# Patient Record
Sex: Female | Born: 1985 | Race: Black or African American | Hispanic: No | Marital: Single | Smoking: Never smoker
Health system: Southern US, Community
[De-identification: ages and names within clinical notes are randomized; demographics above are authoritative.]

## PROBLEM LIST (undated history)

## (undated) DIAGNOSIS — K219 Gastro-esophageal reflux disease without esophagitis: Secondary | ICD-10-CM

## (undated) DIAGNOSIS — M795 Residual foreign body in soft tissue: Secondary | ICD-10-CM

## (undated) DIAGNOSIS — R7303 Prediabetes: Secondary | ICD-10-CM

## (undated) DIAGNOSIS — E119 Type 2 diabetes mellitus without complications: Secondary | ICD-10-CM

## (undated) HISTORY — PX: LIPOSUCTION: SHX10

## (undated) HISTORY — DX: Type 2 diabetes mellitus without complications: E11.9

## (undated) HISTORY — PX: ABDOMINAL SURGERY: SHX537

## (undated) HISTORY — DX: Gastro-esophageal reflux disease without esophagitis: K21.9

---
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
## 2004-02-04 ENCOUNTER — Emergency Department (HOSPITAL_COMMUNITY): Admission: EM | Admit: 2004-02-04 | Discharge: 2004-02-04 | Payer: Self-pay | Admitting: Family Medicine

## 2004-05-23 ENCOUNTER — Other Ambulatory Visit: Admission: RE | Admit: 2004-05-23 | Discharge: 2004-05-23 | Payer: Self-pay | Admitting: Obstetrics and Gynecology

## 2004-10-10 ENCOUNTER — Emergency Department (HOSPITAL_COMMUNITY): Admission: EM | Admit: 2004-10-10 | Discharge: 2004-10-10 | Payer: Self-pay | Admitting: Family Medicine

## 2004-11-13 ENCOUNTER — Other Ambulatory Visit: Admission: RE | Admit: 2004-11-13 | Discharge: 2004-11-13 | Payer: Self-pay | Admitting: Obstetrics and Gynecology

## 2004-12-27 ENCOUNTER — Emergency Department (HOSPITAL_COMMUNITY): Admission: EM | Admit: 2004-12-27 | Discharge: 2004-12-27 | Payer: Self-pay | Admitting: Family Medicine

## 2005-05-29 ENCOUNTER — Other Ambulatory Visit: Admission: RE | Admit: 2005-05-29 | Discharge: 2005-05-29 | Payer: Self-pay | Admitting: Obstetrics and Gynecology

## 2006-04-12 ENCOUNTER — Other Ambulatory Visit: Admission: RE | Admit: 2006-04-12 | Discharge: 2006-04-12 | Payer: Self-pay | Admitting: Obstetrics and Gynecology

## 2006-09-29 ENCOUNTER — Inpatient Hospital Stay (HOSPITAL_COMMUNITY): Admission: AD | Admit: 2006-09-29 | Discharge: 2006-09-29 | Payer: Self-pay | Admitting: Obstetrics and Gynecology

## 2009-07-15 ENCOUNTER — Emergency Department (HOSPITAL_COMMUNITY): Admission: EM | Admit: 2009-07-15 | Discharge: 2009-07-15 | Payer: Self-pay | Admitting: Family Medicine

## 2009-10-26 ENCOUNTER — Emergency Department (HOSPITAL_COMMUNITY): Admission: EM | Admit: 2009-10-26 | Discharge: 2009-10-26 | Payer: Self-pay | Admitting: Family Medicine

## 2010-08-07 ENCOUNTER — Emergency Department (HOSPITAL_COMMUNITY): Admission: EM | Admit: 2010-08-07 | Discharge: 2010-08-07 | Payer: Self-pay | Admitting: Emergency Medicine

## 2010-08-26 ENCOUNTER — Emergency Department (HOSPITAL_COMMUNITY): Admission: EM | Admit: 2010-08-26 | Discharge: 2010-08-26 | Payer: Self-pay | Admitting: Emergency Medicine

## 2010-12-27 LAB — URINALYSIS, ROUTINE W REFLEX MICROSCOPIC
Glucose, UA: NEGATIVE mg/dL
Ketones, ur: NEGATIVE mg/dL
Nitrite: NEGATIVE
Protein, ur: NEGATIVE mg/dL

## 2010-12-27 LAB — WET PREP, GENITAL
WBC, Wet Prep HPF POC: NONE SEEN
Yeast Wet Prep HPF POC: NONE SEEN

## 2010-12-27 LAB — PREGNANCY, URINE: Preg Test, Ur: NEGATIVE

## 2012-01-05 ENCOUNTER — Emergency Department (HOSPITAL_COMMUNITY): Payer: Self-pay

## 2012-01-05 ENCOUNTER — Other Ambulatory Visit: Payer: Self-pay

## 2012-01-05 ENCOUNTER — Encounter (HOSPITAL_COMMUNITY): Payer: Self-pay | Admitting: *Deleted

## 2012-01-05 ENCOUNTER — Emergency Department (HOSPITAL_COMMUNITY)
Admission: EM | Admit: 2012-01-05 | Discharge: 2012-01-05 | Disposition: A | Discharge status: Home / Self Care | Payer: Self-pay | Attending: Emergency Medicine | Admitting: Emergency Medicine

## 2012-01-05 DIAGNOSIS — S41009A Unspecified open wound of unspecified shoulder, initial encounter: Secondary | ICD-10-CM | POA: Insufficient documentation

## 2012-01-05 DIAGNOSIS — Z1889 Other specified retained foreign body fragments: Secondary | ICD-10-CM | POA: Insufficient documentation

## 2012-01-05 DIAGNOSIS — Y92410 Unspecified street and highway as the place of occurrence of the external cause: Secondary | ICD-10-CM | POA: Insufficient documentation

## 2012-01-05 DIAGNOSIS — S21109A Unspecified open wound of unspecified front wall of thorax without penetration into thoracic cavity, initial encounter: Secondary | ICD-10-CM | POA: Insufficient documentation

## 2012-01-05 DIAGNOSIS — W3400XA Accidental discharge from unspecified firearms or gun, initial encounter: Secondary | ICD-10-CM

## 2012-01-05 LAB — POCT I-STAT, CHEM 8
Calcium, Ion: 1.1 mmol/L — ABNORMAL LOW (ref 1.12–1.32)
Chloride: 107 mEq/L (ref 96–112)
Creatinine, Ser: 0.9 mg/dL (ref 0.50–1.10)
Glucose, Bld: 167 mg/dL — ABNORMAL HIGH (ref 70–99)
HCT: 34 % — ABNORMAL LOW (ref 36.0–46.0)

## 2012-01-05 LAB — TYPE AND SCREEN: Unit division: 0

## 2012-01-05 LAB — COMPREHENSIVE METABOLIC PANEL
BUN: 13 mg/dL (ref 6–23)
CO2: 20 mEq/L (ref 19–32)
Calcium: 9.2 mg/dL (ref 8.4–10.5)
Creatinine, Ser: 0.98 mg/dL (ref 0.50–1.10)
GFR calc Af Amer: >90 mL/min (ref >90)
GFR calc non Af Amer: 78 mL/min — ABNORMAL LOW (ref >90)
Glucose, Bld: 159 mg/dL — ABNORMAL HIGH (ref 70–99)

## 2012-01-05 LAB — CBC
Hemoglobin: 12.8 g/dL (ref 12.0–15.0)
Platelets: 170 10*3/uL (ref 150–400)
RBC: 4.63 MIL/uL (ref 3.87–5.11)
WBC: 7.7 10*3/uL (ref 4.0–10.5)

## 2012-01-05 LAB — ABO/RH: ABO/RH(D): A POS

## 2012-01-05 LAB — LACTIC ACID, PLASMA: Lactic Acid, Venous: 6 mmol/L — ABNORMAL HIGH (ref 0.5–2.2)

## 2012-01-05 LAB — PROTIME-INR: Prothrombin Time: 13.5 seconds (ref 11.6–15.2)

## 2012-01-05 MED ORDER — OXYCODONE-ACETAMINOPHEN 5-325 MG PO TABS: 1.0000 | ORAL_TABLET | ORAL | Status: AC | PRN

## 2012-01-05 MED ORDER — TETANUS-DIPHTH-ACELL PERTUSSIS 5-2.5-18.5 LF-MCG/0.5 IM SUSP
0.5000 mL | Freq: Once | INTRAMUSCULAR | Status: AC
  Administered 2012-01-05: 0.5 mL via INTRAMUSCULAR
  Filled 2012-01-05: qty 0.5

## 2012-01-05 MED ORDER — SODIUM CHLORIDE 0.9 % IV BOLUS (SEPSIS)
1000.0000 mL | Freq: Once | INTRAVENOUS | Status: AC
  Administered 2012-01-05: 1000 mL via INTRAVENOUS

## 2012-01-05 MED ORDER — OXYCODONE-ACETAMINOPHEN 5-325 MG PO TABS: 2.0000 | ORAL_TABLET | Freq: Once | ORAL | Status: DC

## 2012-01-05 MED ORDER — MORPHINE SULFATE 4 MG/ML IJ SOLN
4.0000 mg | Freq: Once | INTRAMUSCULAR | Status: AC
  Administered 2012-01-05: 4 mg via INTRAVENOUS
  Filled 2012-01-05: qty 1

## 2012-01-05 MED ORDER — NAPROXEN 500 MG PO TABS: 500.0000 mg | ORAL_TABLET | Freq: Two times a day (BID) | ORAL | Status: DC

## 2012-01-05 MED ORDER — BACITRACIN ZINC 500 UNIT/GM EX OINT
1.0000 "application " | TOPICAL_OINTMENT | CUTANEOUS | Status: DC
  Filled 2012-01-05: qty 0.9

## 2012-01-05 MED ORDER — FENTANYL CITRATE 0.05 MG/ML IJ SOLN: 50.0000 ug | Freq: Once | INTRAMUSCULAR | Status: DC

## 2012-01-05 MED ORDER — BACITRACIN ZINC 500 UNIT/GM EX OINT: TOPICAL_OINTMENT | Freq: Two times a day (BID) | CUTANEOUS | Status: AC

## 2012-01-05 MED ORDER — OXYCODONE-ACETAMINOPHEN 5-325 MG PO TABS
ORAL_TABLET | ORAL | Status: AC
  Filled 2012-01-05: qty 2
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
## 2012-01-05 NOTE — ED Notes (Signed)
Patient rates pain 8/10 at this time
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
## 2012-01-29 ENCOUNTER — Emergency Department (HOSPITAL_COMMUNITY)
Admission: EM | Admit: 2012-01-29 | Discharge: 2012-01-29 | Disposition: A | Discharge status: Home / Self Care | Payer: Self-pay | Attending: Emergency Medicine | Admitting: Emergency Medicine

## 2012-01-29 ENCOUNTER — Encounter (HOSPITAL_COMMUNITY): Payer: Self-pay

## 2012-01-29 DIAGNOSIS — M25519 Pain in unspecified shoulder: Secondary | ICD-10-CM | POA: Insufficient documentation

## 2012-01-29 DIAGNOSIS — Z87828 Personal history of other (healed) physical injury and trauma: Secondary | ICD-10-CM | POA: Insufficient documentation

## 2012-01-29 DIAGNOSIS — M25512 Pain in left shoulder: Secondary | ICD-10-CM

## 2012-01-29 NOTE — Discharge Instructions (Signed)
Take Tylenol or naproxen as prescribed to you earlier needed for pain. Keep your scheduled appointment with the trauma followup clinic in 2 days. Return if her condition worsens for any reason
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
## 2012-03-18 ENCOUNTER — Emergency Department (HOSPITAL_COMMUNITY)
Admission: EM | Admit: 2012-03-18 | Discharge: 2012-03-18 | Disposition: A | Discharge status: Home / Self Care | Payer: Self-pay | Attending: Emergency Medicine | Admitting: Emergency Medicine

## 2012-03-18 ENCOUNTER — Encounter (HOSPITAL_COMMUNITY): Payer: Self-pay

## 2012-03-18 DIAGNOSIS — Y92009 Unspecified place in unspecified non-institutional (private) residence as the place of occurrence of the external cause: Secondary | ICD-10-CM | POA: Insufficient documentation

## 2012-03-18 DIAGNOSIS — S6990XA Unspecified injury of unspecified wrist, hand and finger(s), initial encounter: Secondary | ICD-10-CM

## 2012-03-18 DIAGNOSIS — W260XXA Contact with knife, initial encounter: Secondary | ICD-10-CM | POA: Insufficient documentation

## 2012-03-18 DIAGNOSIS — S61209A Unspecified open wound of unspecified finger without damage to nail, initial encounter: Secondary | ICD-10-CM | POA: Insufficient documentation

## 2012-03-18 HISTORY — DX: Residual foreign body in soft tissue: M79.5

## 2012-03-18 MED ORDER — OXYCODONE-ACETAMINOPHEN 5-325 MG PO TABS: 1.0000 | ORAL_TABLET | Freq: Four times a day (QID) | ORAL | Status: AC | PRN

## 2012-03-18 MED ORDER — LIDOCAINE HCL 1 % IJ SOLN
INTRAMUSCULAR | Status: AC
  Administered 2012-03-18: 17:00:00
  Filled 2012-03-18: qty 20

## 2012-03-18 NOTE — ED Notes (Signed)
Was cutting chicken last night and cut her LT middle finger.  States she cleaned it up, held pressure but it throbbed all night.
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
## 2012-03-18 NOTE — Discharge Instructions (Signed)
Do not drive or operate heavy machinery while taking pain medication. °

## 2012-03-21 NOTE — ED Provider Notes (Signed)
Medical screening examination/treatment/procedure(s) were performed by non-physician practitioner and as supervising physician I was immediately available for consultation/collaboration.    Lamiah Marmol R Hanad Leino, MD 03/21/12 1321 

## 2012-07-10 ENCOUNTER — Emergency Department (HOSPITAL_COMMUNITY): Payer: Self-pay

## 2012-07-10 ENCOUNTER — Emergency Department (HOSPITAL_COMMUNITY)
Admission: EM | Admit: 2012-07-10 | Discharge: 2012-07-10 | Disposition: A | Discharge status: Home / Self Care | Payer: Self-pay | Attending: Emergency Medicine | Admitting: Emergency Medicine

## 2012-07-10 ENCOUNTER — Encounter (HOSPITAL_COMMUNITY): Payer: Self-pay

## 2012-07-10 DIAGNOSIS — H00039 Abscess of eyelid unspecified eye, unspecified eyelid: Secondary | ICD-10-CM | POA: Insufficient documentation

## 2012-07-10 DIAGNOSIS — L03213 Periorbital cellulitis: Secondary | ICD-10-CM

## 2012-07-10 LAB — CBC WITH DIFFERENTIAL/PLATELET
Basophils Absolute: 0 10*3/uL (ref 0.0–0.1)
HCT: 38.7 % (ref 36.0–46.0)
Lymphocytes Relative: 25 % (ref 12–46)
Monocytes Absolute: 0.6 10*3/uL (ref 0.1–1.0)
Neutro Abs: 3.3 10*3/uL (ref 1.7–7.7)
Neutrophils Relative %: 62 % (ref 43–77)
Platelets: 260 10*3/uL (ref 150–400)
RDW: 13.7 % (ref 11.5–15.5)
WBC: 5.4 10*3/uL (ref 4.0–10.5)

## 2012-07-10 MED ORDER — IOHEXOL 300 MG/ML  SOLN
100.0000 mL | Freq: Once | INTRAMUSCULAR | Status: AC | PRN
  Administered 2012-07-10: 100 mL via INTRAVENOUS

## 2012-07-10 MED ORDER — AMOXICILLIN-POT CLAVULANATE 875-125 MG PO TABS: 1.0000 | ORAL_TABLET | Freq: Two times a day (BID) | ORAL | Status: DC

## 2012-07-10 MED ORDER — DEXTROSE 5 % IV SOLN
2.0000 g | Freq: Once | INTRAVENOUS | Status: AC
  Administered 2012-07-10: 2 g via INTRAVENOUS
  Filled 2012-07-10: qty 2

## 2012-07-10 MED ORDER — SODIUM CHLORIDE 0.9 % IV SOLN
INTRAVENOUS | Status: DC
  Administered 2012-07-10: 07:00:00 via INTRAVENOUS

## 2012-07-10 NOTE — ED Notes (Signed)
Per pt, eye started swelling with drainage Friday. Pt went over the counter at pharmacy for eye drop.  Eye has progressively worsened.  Eye half closed with visual changes.  Pt also has swelling to right lower face.  No pain in mouth.  Tenderness noted from ear following jaw line with knot.  No fever noted. Pt has not started new meds, facial creams or makeup.

## 2012-07-10 NOTE — ED Notes (Signed)
Pt has significant rt sided facial edema and left eye redness/edema. Pt states she woke up Sat morning with the swelling, which has worsened. [facility] recommended that she use "irritated eye drops" that have not helped. Pt is now experiencing a loss of vision.
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
## 2013-12-18 ENCOUNTER — Encounter (HOSPITAL_COMMUNITY): Payer: Self-pay | Admitting: Emergency Medicine

## 2013-12-18 ENCOUNTER — Emergency Department (HOSPITAL_COMMUNITY)
Admission: EM | Admit: 2013-12-18 | Discharge: 2013-12-18 | Disposition: A | Discharge status: Home / Self Care | Payer: BC Managed Care – PPO | Attending: Emergency Medicine | Admitting: Emergency Medicine

## 2013-12-18 DIAGNOSIS — Z5189 Encounter for other specified aftercare: Secondary | ICD-10-CM

## 2013-12-18 DIAGNOSIS — E663 Overweight: Secondary | ICD-10-CM | POA: Insufficient documentation

## 2013-12-18 DIAGNOSIS — R109 Unspecified abdominal pain: Secondary | ICD-10-CM | POA: Insufficient documentation

## 2013-12-18 DIAGNOSIS — Z4801 Encounter for change or removal of surgical wound dressing: Secondary | ICD-10-CM | POA: Insufficient documentation

## 2013-12-18 DIAGNOSIS — Z87828 Personal history of other (healed) physical injury and trauma: Secondary | ICD-10-CM | POA: Insufficient documentation

## 2013-12-18 NOTE — ED Notes (Signed)
Pt reports having "tummy tuck" done on 2/20. Had drain taken out after 1 week now reports incision is open and she is having "2 out of 6 signs of infection". She reports pain 7/10. Sts she was told by her plastic surgeon they will not close the incision back up but let it heal.
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
## 2013-12-18 NOTE — Discharge Instructions (Signed)
You were seen today for a wound recheck. Your wound appears well healing with a small gaping area without evidence of overt infection. You should continue antibiotics as directed by your primary surgeon. You should followup with your surgeon regarding further wound care and management. She developed fevers, increasing redness or drainage from the site he should return to your surgeon for recheck.

## 2015-03-02 ENCOUNTER — Other Ambulatory Visit (INDEPENDENT_AMBULATORY_CARE_PROVIDER_SITE_OTHER): Payer: Self-pay | Admitting: General Surgery

## 2015-03-02 NOTE — Addendum Note (Signed)
Addended by: Redmond Pulling Antoniette Peake M on: 03/02/2015 12:33 PM   Modules accepted: Orders

## 2015-03-11 ENCOUNTER — Other Ambulatory Visit (INDEPENDENT_AMBULATORY_CARE_PROVIDER_SITE_OTHER): Payer: Self-pay | Admitting: General Surgery

## 2015-03-11 NOTE — Addendum Note (Signed)
Addended by: Redmond Pulling, Kolter Reaver M on: 03/11/2015 05:05 PM   Modules accepted: Orders

## 2015-03-30 ENCOUNTER — Other Ambulatory Visit: Payer: Self-pay | Admitting: General Surgery

## 2015-04-23 ENCOUNTER — Ambulatory Visit: Payer: Self-pay | Admitting: Dietician

## 2015-04-27 ENCOUNTER — Ambulatory Visit (HOSPITAL_COMMUNITY): Admission: RE | Admit: 2015-04-27 | Payer: Self-pay | Source: Ambulatory Visit

## 2015-05-09 ENCOUNTER — Ambulatory Visit (HOSPITAL_COMMUNITY)
Admission: RE | Admit: 2015-05-09 | Payer: BLUE CROSS/BLUE SHIELD | Source: Ambulatory Visit | Admitting: General Surgery

## 2015-05-09 ENCOUNTER — Encounter (HOSPITAL_COMMUNITY): Admission: RE | Payer: Self-pay | Source: Ambulatory Visit

## 2015-05-09 SURGERY — BREATH TEST, FOR HELICOBACTER PYLORI

## 2015-05-17 ENCOUNTER — Emergency Department (HOSPITAL_COMMUNITY)
Admission: EM | Admit: 2015-05-17 | Discharge: 2015-05-17 | Disposition: A | Discharge status: Home / Self Care | Payer: BLUE CROSS/BLUE SHIELD | Source: Home / Self Care | Attending: Emergency Medicine | Admitting: Emergency Medicine

## 2015-05-17 ENCOUNTER — Encounter (HOSPITAL_COMMUNITY): Payer: Self-pay | Admitting: Emergency Medicine

## 2015-05-17 DIAGNOSIS — H16201 Unspecified keratoconjunctivitis, right eye: Secondary | ICD-10-CM

## 2015-05-17 MED ORDER — FLUORESCEIN SODIUM 1 MG OP STRP
ORAL_STRIP | OPHTHALMIC | Status: AC
Start: 2015-05-17 — End: 2015-05-17
  Filled 2015-05-17: qty 1

## 2015-05-17 MED ORDER — TOBRAMYCIN-DEXAMETHASONE 0.3-0.1 % OP SUSP: 2.0000 [drp] | OPHTHALMIC | Status: DC

## 2015-05-17 NOTE — Discharge Instructions (Signed)
Please use the eyedrops every 4 hours. You have an appointment at [facility] tomorrow morning at 10 AM.
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
## 2015-05-17 NOTE — ED Notes (Signed)
C/o right eye infection Sunday  States eye is swollen, green discharge, drainage Eye drops used as tx

## 2015-12-12 ENCOUNTER — Encounter (HOSPITAL_COMMUNITY): Payer: Self-pay

## 2015-12-12 ENCOUNTER — Emergency Department (HOSPITAL_COMMUNITY)
Admission: EM | Admit: 2015-12-12 | Discharge: 2015-12-12 | Disposition: A | Discharge status: Home / Self Care | Payer: BLUE CROSS/BLUE SHIELD | Attending: Emergency Medicine | Admitting: Emergency Medicine

## 2015-12-12 DIAGNOSIS — N644 Mastodynia: Secondary | ICD-10-CM | POA: Insufficient documentation

## 2015-12-12 NOTE — ED Notes (Signed)
Pt complains of a burning left breast pain for three weeks, she's unable to tell if it's swollen because they are large but she's concerned because it's continued for three weeks and every now and then there will be a shooting pain through that pbreast

## 2016-06-21 ENCOUNTER — Ambulatory Visit (HOSPITAL_COMMUNITY)
Admission: EM | Admit: 2016-06-21 | Discharge: 2016-06-21 | Disposition: A | Discharge status: Home / Self Care | Payer: BLUE CROSS/BLUE SHIELD | Attending: Internal Medicine | Admitting: Internal Medicine

## 2016-06-21 ENCOUNTER — Encounter (HOSPITAL_COMMUNITY): Payer: Self-pay | Admitting: Emergency Medicine

## 2016-06-21 DIAGNOSIS — R739 Hyperglycemia, unspecified: Secondary | ICD-10-CM

## 2016-06-21 DIAGNOSIS — R51 Headache: Secondary | ICD-10-CM

## 2016-06-21 DIAGNOSIS — R7309 Other abnormal glucose: Secondary | ICD-10-CM

## 2016-06-21 DIAGNOSIS — R519 Headache, unspecified: Secondary | ICD-10-CM

## 2016-06-21 LAB — GLUCOSE, CAPILLARY: GLUCOSE-CAPILLARY: 104 mg/dL — AB (ref 65–99)

## 2016-06-21 MED ORDER — ACETAMINOPHEN 325 MG PO TABS
ORAL_TABLET | ORAL | Status: AC
  Filled 2016-06-21: qty 2

## 2016-06-21 MED ORDER — ACETAMINOPHEN 325 MG PO TABS
650.0000 mg | ORAL_TABLET | Freq: Once | ORAL | Status: AC
  Administered 2016-06-21: 650 mg via ORAL

## 2016-06-21 MED ORDER — KETOROLAC TROMETHAMINE 60 MG/2ML IM SOLN
INTRAMUSCULAR | Status: AC
Start: 2016-06-21 — End: 2016-06-21
  Filled 2016-06-21: qty 2

## 2016-06-21 MED ORDER — KETOROLAC TROMETHAMINE 60 MG/2ML IM SOLN
60.0000 mg | Freq: Once | INTRAMUSCULAR | Status: AC
  Administered 2016-06-21: 60 mg via INTRAMUSCULAR

## 2016-06-21 MED ORDER — ACETAMINOPHEN 500 MG PO TABS: 1000.0000 mg | ORAL_TABLET | Freq: Once | ORAL | Status: DC

## 2016-06-21 NOTE — ED Triage Notes (Signed)
Patient has concerns about her work day today.  Patient reports feeling dizzy, seeing white spots in vision fields. Patient reports she still has not eating, patient has a water bottle with her.   Patient does not see white spots now, but continues to have a headache.  Patient dropped off by supervisor.  Patient reports she is still feeling dizzy
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
## 2016-06-21 NOTE — Discharge Instructions (Signed)
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  If you feel rushed or nauseate in the morning and cannot eat a "full" breakfast, you should at least have a granola bar, small bowl of instant oat meal, whole grain cereal, a piece of fruit, or anything else that you can grab on the go.    Be sure to carry a water bottle during your shift and keep small snacks in your bag to help you get through the day if it is too busy for you to take a lunch break.  It is important to establish care with a primary care provider for ongoing health maintenance to make sure you do not have, or are not developing diabetes as it can be very dangerous if it goes untreated.    Emergency Department Resource Guide 1) Find a Doctor and Pay Out of Pocket Although you won't have to find out who is covered by your insurance plan, it is a good idea to ask around and get recommendations. You will then need to call the office and see if the doctor you have chosen will accept you as a new patient and what types of options they offer for patients who are self-pay. Some doctors offer discounts or will set up payment plans for their patients who do not have insurance, but you will need to ask so you aren't surprised when you get to your appointment.  2) Contact Your Local Health Department Not all health departments have doctors that can see patients for sick visits, but many do, so it is worth a call to see if yours does. If you don't know where your local health department is, you can check in your phone book. The CDC also has a tool to help you locate your state's health department, and many state websites also have listings of all of their local health departments.  3) Find a [facility] If your illness is not likely to be very severe or complicated, you may want to try a walk in clinic. These are popping up all over the country in pharmacies, drugstores, and shopping centers. They're usually  staffed by nurse practitioners or physician assistants that have been trained to treat common illnesses and complaints. They're usually fairly quick and inexpensive. However, if you have serious medical issues or chronic medical problems, these are probably not your best option.  No Primary Care Doctor: Call Health Connect at  [phone] - they can help you locate a primary care doctor that  accepts your insurance, provides certain services, etc. Physician Referral Service- [phone]  Chronic Pain Problems: Organization         Address  Phone   Notes  [facility]  [phone] Patients need to be referred by their primary care doctor.   Medication Assistance: Organization         Address  Phone   Notes  [facility] Medication [address] [address] [place], [place] [address] [phone] --Must be a resident of [facility] -- Must have NO insurance coverage whatsoever (no Medicaid/ Medicare, etc.) -- The pt. MUST have a primary care doctor that directs their care regularly and follows them in the community   MedAssist  [phone]   [employer]  [phone]    Agencies that provide inexpensive medical care: Organization         Address  Phone                                                                            Notes  [facility]  [phone]   [facility]    [phone]   [facility] [place], [place] [address] [phone]   [place]. [address] [place] [phone]   Planned Parenthood    [phone]   [facility]    [phone]   [address], [place] Phone:  [phone], Fax:  [phone] Hours of Operation:  9 am - 6 pm, M-F.  Also accepts Medicaid/Medicare and self-pay.  [facility] for [place], [address], [place] Phone: [phone], Fax: [phone]. Hours of Operation:  8:30 am - 5:30 pm, M-F.  Also accepts Medicaid and self-pay.  [facility] [place] [address] [place] Phone: [phone]   [place], [place], [place] [phone], Ext. [phone] Mondays & Thursdays: 7-9 AM.  First 15 patients are seen on a first come, first serve basis.    [place] Providers:  Organization         Address                                                                       Phone                               Notes  [facility] [address], [place] [phone] Also accepts self-pay patients.  [facility] [id number] [place], [place]  [phone]   [place], [address], [place] [phone]   [facility] [address] [place] [phone]   Lucianne Lei [address], [place]   [phone] Only accepts [place] Access [place] patients after they have their name applied to their card.   Self-Pay (no insurance) in [facility]:   Organization         Address                                                     Phone               Notes  Sickle Cell Patients, [facility] Internal Medicine [place] [phone]   [facility] [place] [phone]   [facility] Pont[facility] Urgent [place]  [place]  [place], [address], [place] [phone]   [facility]/Dr. Osei-Bonsu  [address] [place] or [address], [place] [phone] Phone number for both [place] and [place] locations is the same.  Urgent Medical and [facility] [address], [place] [phone]   [facility] [address], [place] or [address] [phone]   [facility] [address] [place] [phone], phone; [phone], fax Sees patients 1st and 3rd Saturday of every month.  Must not qualify for public or private insurance (i.e. Medicaid, Medicare, [place] Health Choice, Veterans' Benefits)  Household income should be no more than 200% of the poverty level The clinic cannot treat you if you are pregnant or think you are pregnant  Sexually transmitted diseases are not treated at the clinic.    Dental Care: Organization         Address                                  Phone                       Notes  [facility] Department of [facility] [phone] Accepts children up to age 110 who are enrolled in [place] or [place]; pregnant women with a Medicaid card; and children who have applied for Medicaid or [place] Health Choice, but were declined, whose parents can pay a reduced fee at time of service.  [facility] of [facility]  [address], [place] [phone] Accepts children up to age 83 who are enrolled in [place] or [place]; pregnant women with a Medicaid card; and children who have applied for Medicaid or [place] Health Choice, but were declined, whose parents can pay a reduced fee at time of service.  [facility] Access PRO[facility]  [place] [phone] Patients are seen by appointment only. Walk-ins are not accepted. [place] will see patients 58 years of age and older. Monday - Tuesday (8am-5pm) Most Wednesdays (8:30-5pm) $30 per visit, cash only  [facility] Adult Dental Access PROGRAM  [address], [facility] [phone] Patients are seen by appointment only. Walk-ins are not accepted. [place] will see patients 59 years of age and older. One Wednesday Evening (Monthly: Volunteer Based).  $30 per visit,  cash only  [place]  [phone] for adults; Children under age 18, call Graduate Pediatric Dentistry at [phone]. Children aged 76-14, please call [phone] to request a pediatric application.  Dental services are provided in all areas of dental care including fillings, crowns and bridges, complete and partial dentures, implants, gum treatment, root canals, and extractions. Preventive care is also provided. Treatment is provided to both adults and children. Patients are selected via a lottery and there is often a waiting list.   [facility] [address], [place]  [phone] [email or web address]   [facility] [address] [place], [place] [phone], Ext. [phone] Second and Fourth Thursday of each month, opens at 6:30 AM; Clinic ends at 9 AM.  Patients are seen on a first-come first-served basis, and a limited number are seen during each clinic.   Community  [place]  [address] [place], [place] [phone]   Eligibility Requirements You must have lived in [place], [place], or [place] counties for at least the last three months.   You cannot be eligible for state or federal sponsored [employer], including [employer], [place], or [employer].   You generally cannot be eligible for healthcare insurance through your employer.    How to apply: Eligibility screenings are held every Tuesday and Wednesday afternoon from 1:00 pm until 4:00 pm. You do not need an appointment for the interview!  [facility] [address] [place], [place]   [place]  [place] Department  [place]  [phone]    Behavioral Health Resources in the Community: Intensive Outpatient Programs Organization         Address                                              Phone               Notes  [place]. [address] [place], [place] [phone]   [facility] Outpatient [address], [place], [place]   ADS: Alcohol & Drug Svcs [address], [place], [place]   [place] [address]  [place], [place] or [phone]   Substance Abuse Resources Organization         Address                                Phone  Notes  Alcohol and Drug Services  [phone]   [place]  [phone]   The [place]   Chinita Pester  [phone]   Residential & Outpatient Substance Abuse Program  [phone]   Psychological Services Organization         Address                                  Phone                Notes  [facility]  [place]  [phone]   [place] [address] [place] or [phone]    Mobile Crisis Teams Organization         Address  Phone  Notes  [facility], Mobile Crisis Care Unit  [phone]   [facility]  [address] [place], [place]   [place] Levels [address], [place] [phone]    Self-Help/Support Groups Organization         Address                         Phone             Notes  [place]. of [place] - variety of support groups  [place] Call for more information  Narcotics Anonymous (NA), [facility] [address], [employer] [place]  2 meetings at this location   Special educational needs teacher  Address                                                    Phone              Notes  [facility] [address]    [place]  [phone]   [facility]  [address] [place] [phone], [place], [place]   [place], [place]  [phone] Admissions: 8am-3pm M-F  Incentives Substance [place] [address]    [place], [place] [phone]   [facility] [address] [place], [place], [place]   [facility] [address]  [place], [place]   [facility] - Intensive Outpatient [address], [place], [place]   [facility] [place].,  [place], [place] [phone] or [phone]   [facility] Services (RTS) [address], [place], [place] Accepts Medicaid  [facility] [place] [address]  [place] [phone] Substance Abuse/Addiction Treatment   [facility] Organization         Address                                                            Phone                    Notes  [facility]  [phone]   Domenic Schwab, PhD [address] Arlis [place], [place]   [phone] or [phone]   [place], [place] [phone]   [facility] [address] [place], [place] [phone] Insurance/Medicaid/sponsorship through [facility] and Families [address], [address] [place]                                    [place], [place] [phone] [place] [address], [place] [phone]    Dr. Adele Schilder  [phone]   [facility] [place] Dept. 1) [address] [place] 2) [place] 3)  [place] [address], [place] [phone]  [phone]   [place] [phone] or [phone] (After Hours)

## 2016-11-05 ENCOUNTER — Other Ambulatory Visit: Payer: Self-pay | Admitting: Obstetrics and Gynecology

## 2016-11-05 ENCOUNTER — Other Ambulatory Visit (HOSPITAL_COMMUNITY)
Admission: RE | Admit: 2016-11-05 | Discharge: 2016-11-05 | Disposition: A | Discharge status: Home / Self Care | Payer: BLUE CROSS/BLUE SHIELD | Source: Ambulatory Visit | Attending: Obstetrics and Gynecology | Admitting: Obstetrics and Gynecology

## 2016-11-05 DIAGNOSIS — Z113 Encounter for screening for infections with a predominantly sexual mode of transmission: Secondary | ICD-10-CM | POA: Insufficient documentation

## 2016-11-05 DIAGNOSIS — Z1151 Encounter for screening for human papillomavirus (HPV): Secondary | ICD-10-CM | POA: Diagnosis present

## 2016-11-05 DIAGNOSIS — Z01411 Encounter for gynecological examination (general) (routine) with abnormal findings: Secondary | ICD-10-CM | POA: Insufficient documentation

## 2016-11-09 LAB — CYTOLOGY - PAP
CHLAMYDIA, DNA PROBE: NEGATIVE
Diagnosis: UNDETERMINED — AB
HPV: NOT DETECTED
NEISSERIA GONORRHEA: NEGATIVE

## 2017-11-13 ENCOUNTER — Other Ambulatory Visit: Payer: Self-pay | Admitting: Obstetrics and Gynecology

## 2017-11-13 ENCOUNTER — Other Ambulatory Visit (HOSPITAL_COMMUNITY)
Admission: RE | Admit: 2017-11-13 | Discharge: 2017-11-13 | Disposition: A | Discharge status: Home / Self Care | Payer: Commercial Managed Care - PPO | Source: Ambulatory Visit | Attending: Obstetrics and Gynecology | Admitting: Obstetrics and Gynecology

## 2017-11-13 DIAGNOSIS — Z124 Encounter for screening for malignant neoplasm of cervix: Secondary | ICD-10-CM | POA: Diagnosis present

## 2017-11-15 LAB — CYTOLOGY - PAP
Diagnosis: NEGATIVE
HPV (WINDOPATH): NOT DETECTED

## 2018-02-16 ENCOUNTER — Ambulatory Visit (HOSPITAL_COMMUNITY)
Admission: EM | Admit: 2018-02-16 | Discharge: 2018-02-16 | Disposition: A | Discharge status: Home / Self Care | Payer: Worker's Compensation | Attending: Physician Assistant | Admitting: Physician Assistant

## 2018-02-16 ENCOUNTER — Encounter (HOSPITAL_COMMUNITY): Payer: Self-pay | Admitting: Emergency Medicine

## 2018-02-16 DIAGNOSIS — S29012A Strain of muscle and tendon of back wall of thorax, initial encounter: Secondary | ICD-10-CM | POA: Diagnosis not present

## 2018-02-16 MED ORDER — NAPROXEN 500 MG PO TABS: 500.0000 mg | ORAL_TABLET | Freq: Two times a day (BID) | ORAL | 0 refills | Status: DC

## 2018-02-16 MED ORDER — CYCLOBENZAPRINE HCL 10 MG PO TABS: 5.0000 mg | ORAL_TABLET | Freq: Three times a day (TID) | ORAL | 0 refills | Status: DC | PRN
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
## 2018-02-16 NOTE — Discharge Instructions (Addendum)
Please consider using a heating pain to the afected area along with the meds I have prescribed. Okay to take tylenol 1000 mg along with prescriptions as needed, but avoid all other OTC meds.  Come back in ten days if your not improving.
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
## 2018-05-26 ENCOUNTER — Other Ambulatory Visit: Payer: Self-pay | Admitting: Occupational Medicine

## 2018-05-26 ENCOUNTER — Ambulatory Visit: Payer: Self-pay

## 2018-05-26 DIAGNOSIS — M79641 Pain in right hand: Secondary | ICD-10-CM

## 2018-09-29 ENCOUNTER — Other Ambulatory Visit: Payer: Self-pay | Admitting: Family Medicine

## 2018-09-29 ENCOUNTER — Ambulatory Visit: Payer: Self-pay

## 2018-09-29 DIAGNOSIS — M25562 Pain in left knee: Secondary | ICD-10-CM

## 2018-10-16 ENCOUNTER — Other Ambulatory Visit: Payer: Self-pay | Admitting: Physician Assistant

## 2018-10-16 DIAGNOSIS — M25562 Pain in left knee: Secondary | ICD-10-CM

## 2018-10-25 ENCOUNTER — Ambulatory Visit
Admission: RE | Admit: 2018-10-25 | Discharge: 2018-10-25 | Disposition: A | Discharge status: Home / Self Care | Payer: Worker's Compensation | Source: Ambulatory Visit | Attending: Physician Assistant | Admitting: Physician Assistant

## 2018-10-25 ENCOUNTER — Ambulatory Visit
Admission: RE | Admit: 2018-10-25 | Discharge: 2018-10-25 | Disposition: A | Discharge status: Home / Self Care | Payer: Worker's Compensation | Source: Ambulatory Visit | Attending: Diagnostic Radiology | Admitting: Diagnostic Radiology

## 2018-10-25 ENCOUNTER — Other Ambulatory Visit: Payer: Self-pay | Admitting: Diagnostic Radiology

## 2018-10-25 DIAGNOSIS — M795 Residual foreign body in soft tissue: Secondary | ICD-10-CM

## 2018-10-25 DIAGNOSIS — M25562 Pain in left knee: Secondary | ICD-10-CM

## 2020-06-23 ENCOUNTER — Other Ambulatory Visit: Payer: Self-pay

## 2020-06-23 ENCOUNTER — Ambulatory Visit (HOSPITAL_COMMUNITY)
Admission: EM | Admit: 2020-06-23 | Discharge: 2020-06-23 | Disposition: A | Discharge status: Home / Self Care | Payer: Self-pay

## 2020-08-04 ENCOUNTER — Telehealth: Payer: Medicaid Other | Admitting: Emergency Medicine

## 2020-08-04 DIAGNOSIS — Z1152 Encounter for screening for COVID-19: Secondary | ICD-10-CM

## 2020-08-04 NOTE — Progress Notes (Signed)
E-Visit for Corona Virus Screening  At this time, your symptoms do not sound consistent with COVID-19. If your symptoms change, you may need to get tested.  Many health care providers can now test patients at their office but not all are.  [place] has multiple testing sites. For information on our [place] testing locations and hours go to [email or web address]  We are enrolling you in our [place] for [place] . Daily you will receive a questionnaire within the [place] website. Our COVID 19 response team will be monitoring your responses daily.  Testing Information: The COVID-19 Community Testing sites are testing BY APPOINTMENT ONLY.  You can schedule online at [email or web address]  If you do not have access to a smart phone or computer you may call [phone] for an appointment.   Additional testing sites in the Community:  . For [facility] Testing sites in [facility]  FaceUpdate.uy  . For Pop-up testing sites in [place]  BowlDirectory.co.uk  . For [facility] [email or web address]  . For [facility] testing in [place] and [employer] [email or web address]  . For [facility] testing in [facility]   [email or web address]  For  more information about community testing call [phone]   Please quarantine yourself while awaiting your test results. Please stay home for a minimum of 10 days from the first day of illness with improving symptoms and you have had 24 hours of no fever (without the use of Tylenol (Acetaminophen) Motrin (Ibuprofen) or any fever reducing medication).  Also - Do not get tested prior to returning to work because  once you have had a positive test the test can stay positive for more than a month in some cases.   You should wear a mask or cloth face covering over your nose and mouth if you must be around other people or animals, including pets (even at home). Try to stay at least 6 feet away from other people. This will protect the people around you.  Please continue good preventive care measures, including:  frequent hand-washing, avoid touching your face, cover coughs/sneezes, stay out of crowds and keep a 6 foot distance from others.  COVID-19 is a respiratory illness with symptoms that are similar to the flu. Symptoms are typically mild to moderate, but there have been cases of severe illness and death due to the virus.   The following symptoms may appear 2-14 days after exposure: . Fever . Cough . Shortness of breath or difficulty breathing . Chills . Repeated shaking with chills . Muscle pain . Headache . Sore throat . New loss of taste or smell . Fatigue . Congestion or runny nose . Nausea or vomiting . Diarrhea  Go to the nearest hospital ED for assessment if fever/cough/breathlessness are severe or illness seems like a threat to life.  It is vitally important that if you feel that you have an infection such as this virus or any other virus that you stay home and away from places where you may spread it to others.  You should avoid contact with people age 44 and older.   You may also take acetaminophen (Tylenol) as needed for fever.  Reduce your risk of any infection by using the same precautions used for avoiding the common cold or flu:  [place] Kitchen Wash your hands often with soap and warm water for at least 20 seconds.  If soap and water are not readily available, use an alcohol-based hand sanitizer with at least 60% alcohol.  . If coughing  or sneezing, cover your mouth and nose by coughing or sneezing into the elbow areas of your shirt or coat, into a tissue or into your sleeve (not your  hands). . Avoid shaking hands with others and consider head nods or verbal greetings only. . Avoid touching your eyes, nose, or mouth with unwashed hands.  . Avoid close contact with people who are sick. . Avoid places or events with large numbers of people in one location, like concerts or sporting events. . Carefully consider travel plans you have or are making. . If you are planning any travel outside or inside the [place], visit the CDC's Travelers' Health webpage for the latest health notices. . If you have some symptoms but not all symptoms, continue to monitor at home and seek medical attention if your symptoms worsen. . If you are having a medical emergency, call 911.  HOME CARE . Only take medications as instructed by your medical team. . Drink plenty of fluids and get plenty of rest. . A steam or ultrasonic humidifier can help if you have congestion.   GET HELP RIGHT AWAY IF YOU HAVE EMERGENCY WARNING SIGNS** FOR COVID-19. If you or someone is showing any of these signs seek emergency medical care immediately. Call 911 or proceed to your closest emergency facility if: . You develop worsening high fever. . Trouble breathing . Bluish lips or face . Persistent pain or pressure in the chest . New confusion . Inability to wake or stay awake . You cough up blood. . Your symptoms become more severe  **This list is not all possible symptoms. Contact your medical provider for any symptoms that are sever or concerning to you.  MAKE SURE YOU   Understand these instructions.  Will watch your condition.  Will get help right away if you are not doing well or get worse.  Your e-visit answers were reviewed by a board certified advanced clinical practitioner to complete your personal care plan.  Depending on the condition, your plan could have included both over the counter or prescription medications.  If there is a problem please reply once you have received a response from your  provider.  Your safety is important to [place].  If you have drug allergies check your prescription carefully.    You can use MyChart to ask questions about today's visit, request a non-urgent call back, or ask for a work or school excuse for 24 hours related to this e-Visit. If it has been greater than 24 hours you will need to follow up with your provider, or enter a new e-Visit to address those concerns. You will get an e-mail in the next two days asking about your experience.  I hope that your e-visit has been valuable and will speed your recovery. Thank you for using e-visits.  I spent 5-10 minutes reviewing the patient's medical history and chart.

## 2021-06-05 ENCOUNTER — Other Ambulatory Visit (HOSPITAL_COMMUNITY): Payer: Self-pay | Admitting: General Surgery

## 2021-06-05 ENCOUNTER — Other Ambulatory Visit: Payer: Self-pay | Admitting: General Surgery

## 2021-06-05 DIAGNOSIS — R7303 Prediabetes: Secondary | ICD-10-CM

## 2021-06-05 DIAGNOSIS — K219 Gastro-esophageal reflux disease without esophagitis: Secondary | ICD-10-CM

## 2021-07-14 ENCOUNTER — Ambulatory Visit (HOSPITAL_COMMUNITY)
Admission: RE | Admit: 2021-07-14 | Discharge: 2021-07-14 | Disposition: A | Discharge status: Home / Self Care | Payer: BC Managed Care – PPO | Source: Ambulatory Visit | Attending: General Surgery | Admitting: General Surgery

## 2021-07-14 ENCOUNTER — Other Ambulatory Visit: Payer: Self-pay

## 2021-07-14 DIAGNOSIS — R7303 Prediabetes: Secondary | ICD-10-CM

## 2021-07-14 DIAGNOSIS — K219 Gastro-esophageal reflux disease without esophagitis: Secondary | ICD-10-CM

## 2021-07-17 ENCOUNTER — Encounter: Payer: BC Managed Care – PPO | Attending: General Surgery | Admitting: Skilled Nursing Facility1

## 2021-07-17 ENCOUNTER — Encounter: Payer: Self-pay | Admitting: Skilled Nursing Facility1

## 2021-07-17 ENCOUNTER — Other Ambulatory Visit: Payer: Self-pay

## 2021-07-17 DIAGNOSIS — K219 Gastro-esophageal reflux disease without esophagitis: Secondary | ICD-10-CM | POA: Insufficient documentation

## 2021-07-17 DIAGNOSIS — E669 Obesity, unspecified: Secondary | ICD-10-CM

## 2021-07-17 DIAGNOSIS — Z6841 Body Mass Index (BMI) 40.0 and over, adult: Secondary | ICD-10-CM | POA: Insufficient documentation

## 2021-07-17 DIAGNOSIS — R7303 Prediabetes: Secondary | ICD-10-CM | POA: Insufficient documentation

## 2021-07-17 DIAGNOSIS — E119 Type 2 diabetes mellitus without complications: Secondary | ICD-10-CM

## 2021-07-17 NOTE — Progress Notes (Addendum)
Nutrition Assessment for Bariatric Surgery Medical Nutrition Therapy Appt Start Time: 2:05    End Time: 3:00  Patient was seen on 07/17/2021 for Pre-Operative Nutrition Assessment. Letter of approval faxed to [facility] bariatric surgery program coordinator on 07/17/2021.   Referral stated Supervised Weight Loss (SWL) visits needed: 0  Pt completed visits.   Pt has cleared nutrition requirements.   Pt states she is thinking she will try it on her own now that she has the knowledge for 6 months, Dietitian advised pt to let [facility] know of her plans   Planned surgery: RYGB Pt expectation of surgery: to maintain a better eating habit Pt expectation of dietitian: to help educate   Body Composition Scale 07/17/2021  Current Body Weight 291.7  Total Body Fat % 48.9  Visceral Fat 18  Fat-Free Mass % 51   Total Body Water % 40  Muscle-Mass lbs 31.9  BMI 52.6  Body Fat Displacement          Torso  lbs 88.6         Left Leg  lbs 17.7         Right Leg  lbs 17.7         Left Arm  lbs 8.8         Right Arm   lbs 8.8      NUTRITION ASSESSMENT   Anthropometrics  Start weight at [facility]: 294.1 lbs (date: 07/17/2021)  Height: 62.5 in BMI: 52.93 kg/m2     Clinical  Medical hx: DM, GERD Medications: Ozempic  Labs: A1C 5.7 Notable signs/symptoms: reflux, constipation  Any previous deficiencies? No  Micronutrient Nutrition Focused Physical Exam: Hair: No issues observed Eyes: No issues observed Mouth: No issues observed Neck: No issues observed Nails: No issues observed Skin: No issues observed  Lifestyle & Dietary Hx  Pt states she has had diabetes since March.  Pt states she does not check her blood sugar.  Pt states she wasn't to learn what she is supposed to eat and the portion sizes she needs to stick to.  Pt states she has been exercising more lately.  Pt state she does zumba 2 times a week and tries to do some walking.  Pt states she avoids eating after 7pm.  Not starting until 11am.  Pt states she has used laxatives to lose weight last year.   24-Hr Dietary Recall First Meal: starbucks coffee + 2 splenda  Snack:  Second Meal: chips + pineapples + apple Snack: animal crackers Third Meal: chicken or seafood or pasta Snack:  Beverages: diet green tea, sugar free lemonade, water   Estimated Energy Needs Calories: 1500   NUTRITION DIAGNOSIS  Overweight/obesity ([place]-3.3) related to past poor dietary habits and physical inactivity as evidenced by patient w/ planned  RYGB surgery following dietary guidelines for continued weight loss.    NUTRITION INTERVENTION  Nutrition counseling (C-1) and education (E-2) to facilitate bariatric surgery goals.  Educated pt on micronutrient deficiencies post surgery and strategies to mitigate that risk   Pre-Op Goals Reviewed with the Patient Track food and beverage intake (pen and paper, MyFitness Pal, Baritastic app, etc.) Make healthy food choices while monitoring portion sizes Consume 3 meals per day or try to eat every 3-5 hours Avoid concentrated sugars and fried foods Keep sugar & fat in the single digits per serving on food labels Practice CHEWING your food (aim for applesauce consistency) Practice not drinking 15 minutes before, during, and 30 minutes after each meal and  snack Avoid all carbonated beverages (ex: soda, sparkling beverages)  Limit caffeinated beverages (ex: coffee, tea, energy drinks) Avoid all sugar-sweetened beverages (ex: regular soda, sports drinks)  Avoid alcohol  Aim for 64-100 ounces of FLUID daily (with at least half of fluid intake being plain water)  Aim for at least 60-80 grams of PROTEIN daily Look for a liquid protein source that contains ?15 g protein and ?5 g carbohydrate (ex: shakes, drinks, shots) Make a list of non-food related activities Physical activity is an important part of a healthy lifestyle so keep it moving! The goal is to reach 150 minutes of exercise  per week, including cardiovascular and weight baring activity.  *Goals that are bolded indicate the pt would like to start working towards these  Handouts Provided Include  Bariatric Surgery handouts (Nutrition Visits, Pre-Op Goals, Protein Shakes, Vitamins & Minerals)  Learning Style & Readiness for Change Teaching method utilized: Visual & Auditory  Demonstrated degree of understanding via: Teach Back  Readiness Level: action Barriers to learning/adherence to lifestyle change: none identified      MONITORING & EVALUATION Dietary intake, weekly physical activity, body weight, and pre-op goals reached at next nutrition visit.    Next Steps  Patient is to follow up at [facility] for Pre-Op Class >2 weeks before surgery for further nutrition education.  Pt has completed visits. No further supervised visits required/recomended

## 2021-08-21 ENCOUNTER — Encounter: Payer: BC Managed Care – PPO | Attending: General Surgery | Admitting: Skilled Nursing Facility1

## 2021-08-21 ENCOUNTER — Other Ambulatory Visit: Payer: Self-pay

## 2021-08-21 DIAGNOSIS — E669 Obesity, unspecified: Secondary | ICD-10-CM | POA: Diagnosis not present

## 2021-08-21 NOTE — Progress Notes (Signed)
Pre-Operative Nutrition Class:    Patient was seen on 08/21/2021 for Pre-Operative Bariatric Surgery Education at [facility].    Surgery date:  Surgery type: RYGB Start weight at [facility]: 294 Weight today: 287  Samples given per MNT protocol. Patient educated on appropriate usage: Ensure max exp: December 13, 2021 Ensure max lot: [id number]  Chewable bariatric advantage: advanced multi EA exp: 08/23 Chewable bariatric advantage: advanced multi EA lot: [id number]  Bariatric advantage calcium citrate exp: 02/23 Bariatric advantage calcium citrate lot: [id number]   The following the learning objectives were met by the patient during this course: Identify Pre-Op Dietary Goals and will begin 2 weeks pre-operatively Identify appropriate sources of fluids and proteins  State protein recommendations and appropriate sources pre and post-operatively Identify Post-Operative Dietary Goals and will follow for 2 weeks post-operatively Identify appropriate multivitamin and calcium sources Describe the need for physical activity post-operatively and will follow MD recommendations State when to call healthcare provider regarding medication questions or post-operative complications When having a diagnosis of diabetes understanding hypoglycemia symptoms and the inclusion of 1 complex carbohydrate per meal  Handouts given during class include: Pre-Op Bariatric Surgery Diet Handout Protein Shake Handout Post-Op Bariatric Surgery Nutrition Handout BELT Program Information Flyer Support Group Information Flyer [facility] Bariatric Supplements Price List  Follow-Up Plan: Patient will follow-up at [facility] 2 weeks post operatively for diet advancement per MD.

## 2021-08-31 ENCOUNTER — Ambulatory Visit: Payer: Self-pay | Admitting: General Surgery

## 2021-08-31 DIAGNOSIS — Z01818 Encounter for other preprocedural examination: Secondary | ICD-10-CM

## 2021-08-31 NOTE — Progress Notes (Signed)
Sent message, via epic in basket, requesting orders in epic from surgeon.  
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
## 2021-09-12 NOTE — Progress Notes (Signed)
DUE TO COVID-19 ONLY ONE VISITOR IS ALLOWED TO COME WITH YOU AND STAY IN THE WAITING ROOM ONLY DURING PRE OP AND PROCEDURE DAY OF SURGERY.  2 VISITOR  MAY VISIT WITH YOU AFTER SURGERY IN YOUR PRIVATE ROOM DURING VISITING HOURS ONLY!  YOU NEED TO HAVE A COVID 19 TEST ON__12/05/2021 @_  @_from  8am-3pm _____, THIS TEST MUST BE DONE BEFORE SURGERY,  Covid test is done at [place], [place] [address].  This is a drive thru.  No appt required. Please see map.                 Your procedure is scheduled on:    09/25/2021   Report to [facility] Main  Entrance   Report to admitting at   [phone]     Call this number if you have problems the morning of surgery [phone]    REMEMBER: NO  SOLID FOOD CANDY OR GUM AFTER MIDNIGHT. CLEAR LIQUIDS UNTIL   0830am        . NOTHING BY MOUTH EXCEPT CLEAR LIQUIDS UNTIL 0830am    . PLEASE FINISH ENSURE DRINK PER SURGEON ORDER  WHICH NEEDS TO BE COMPLETED AT     0830am  .      CLEAR LIQUID DIET   Foods Allowed                                                                    Coffee and tea, regular and decaf                            Fruit ices (not with fruit pulp)                                      Iced Popsicles                                    Carbonated beverages, regular and diet                                    Cranberry, grape and apple juices Sports drinks like Gatorade Lightly seasoned clear broth or consume(fat free) Sugar, honey syrup ___________________________________________________________________      BRUSH YOUR TEETH MORNING OF SURGERY AND RINSE YOUR MOUTH OUT, NO CHEWING GUM CANDY OR MINTS.     Take these medicines the morning of surgery with A SIP OF WATER:   DO NOT TAKE ANY DIABETIC MEDICATIONS DAY OF YOUR SURGERY                               You may not have any metal on your body including hair pins and              piercings  Do not wear jewelry, make-up, lotions, powders or perfumes,  deodorant             Do not wear nail  polish on your fingernails.  Do not shave  48 hours prior to surgery.              Men may shave face and neck.   Do not bring valuables to the hospital. [place].  Contacts, dentures or bridgework may not be worn into surgery.  Leave suitcase in the car. After surgery it may be brought to your room.     Patients discharged the day of surgery will not be allowed to drive home. IF YOU ARE HAVING SURGERY AND GOING HOME THE SAME DAY, YOU MUST HAVE AN ADULT TO DRIVE YOU HOME AND BE WITH YOU FOR 24 HOURS. YOU MAY GO HOME BY TAXI OR UBER OR ORTHERWISE, BUT AN ADULT MUST ACCOMPANY YOU HOME AND STAY WITH YOU FOR 24 HOURS.  Name and phone number of your driver:  Special Instructions: N/A              Please read over the following fact sheets you were given: _____________________________________________________________________  [facility] - Preparing for Surgery Before surgery, you can play an important role.  Because skin is not sterile, your skin needs to be as free of germs as possible.  You can reduce the number of germs on your skin by washing with CHG (chlorahexidine gluconate) soap before surgery.  CHG is an antiseptic cleaner which kills germs and bonds with the skin to continue killing germs even after washing. Please DO NOT use if you have an allergy to CHG or antibacterial soaps.  If your skin becomes reddened/irritated stop using the CHG and inform your nurse when you arrive at Short Stay. Do not shave (including legs and underarms) for at least 48 hours prior to the first CHG shower.  You may shave your face/neck. Please follow these instructions carefully:  1.  Shower with CHG Soap the night before surgery and the  morning of Surgery.  2.  If you choose to wash your hair, wash your hair first as usual with your  normal  shampoo.  3.  After you shampoo, rinse your hair and body thoroughly to remove  the  shampoo.                           4.  Use CHG as you would any other liquid soap.  You can apply chg directly  to the skin and wash                       Gently with a scrungie or clean washcloth.  5.  Apply the CHG Soap to your body ONLY FROM THE NECK DOWN.   Do not use on face/ open                           Wound or open sores. Avoid contact with eyes, ears mouth and genitals (private parts).                       Wash face,  Genitals (private parts) with your normal soap.             6.  Wash thoroughly, paying special attention to the area where your surgery  will be performed.  7.  Thoroughly rinse your body with warm water from the  neck down.  8.  DO NOT shower/wash with your normal soap after using and rinsing off  the CHG Soap.                9.  Pat yourself dry with a clean towel.            10.  Wear clean pajamas.            11.  Place clean sheets on your bed the night of your first shower and do not  sleep with pets. Day of Surgery : Do not apply any lotions/deodorants the morning of surgery.  Please wear clean clothes to the hospital/surgery center.  FAILURE TO FOLLOW THESE INSTRUCTIONS MAY RESULT IN THE CANCELLATION OF YOUR SURGERY PATIENT SIGNATURE_________________________________  NURSE SIGNATURE__________________________________  ________________________________________________________________________

## 2021-09-15 ENCOUNTER — Encounter (HOSPITAL_COMMUNITY): Payer: Self-pay | Admitting: General Surgery

## 2021-09-15 ENCOUNTER — Other Ambulatory Visit: Payer: Self-pay

## 2021-09-15 DIAGNOSIS — Z01812 Encounter for preprocedural laboratory examination: Secondary | ICD-10-CM | POA: Diagnosis present

## 2021-09-15 DIAGNOSIS — R7303 Prediabetes: Secondary | ICD-10-CM | POA: Diagnosis not present

## 2021-09-18 ENCOUNTER — Other Ambulatory Visit (HOSPITAL_COMMUNITY): Payer: Self-pay

## 2021-09-18 ENCOUNTER — Encounter (HOSPITAL_COMMUNITY)
Admission: RE | Admit: 2021-09-18 | Discharge: 2021-09-18 | Disposition: A | Discharge status: Home / Self Care | Payer: BC Managed Care – PPO | Source: Ambulatory Visit | Attending: General Surgery | Admitting: General Surgery

## 2021-09-18 DIAGNOSIS — Z01812 Encounter for preprocedural laboratory examination: Secondary | ICD-10-CM | POA: Insufficient documentation

## 2021-09-18 DIAGNOSIS — R7303 Prediabetes: Secondary | ICD-10-CM | POA: Insufficient documentation

## 2021-09-21 ENCOUNTER — Encounter (HOSPITAL_COMMUNITY)
Admission: RE | Admit: 2021-09-21 | Discharge: 2021-09-21 | Disposition: A | Discharge status: Home / Self Care | Payer: BC Managed Care – PPO | Source: Ambulatory Visit | Attending: General Surgery | Admitting: General Surgery

## 2021-09-21 ENCOUNTER — Other Ambulatory Visit: Payer: Self-pay | Admitting: General Surgery

## 2021-09-21 DIAGNOSIS — Z01812 Encounter for preprocedural laboratory examination: Secondary | ICD-10-CM | POA: Diagnosis not present

## 2021-09-21 DIAGNOSIS — Z01818 Encounter for other preprocedural examination: Secondary | ICD-10-CM

## 2021-09-21 DIAGNOSIS — R7303 Prediabetes: Secondary | ICD-10-CM | POA: Insufficient documentation

## 2021-09-21 LAB — CBC WITH DIFFERENTIAL/PLATELET
Abs Immature Granulocytes: 0.01 10*3/uL (ref 0.00–0.07)
Basophils Absolute: 0 10*3/uL (ref 0.0–0.1)
Basophils Relative: 0 %
Eosinophils Absolute: 0.2 10*3/uL (ref 0.0–0.5)
Eosinophils Relative: 3 %
HCT: 36.1 % (ref 36.0–46.0)
Hemoglobin: 11.7 g/dL — ABNORMAL LOW (ref 12.0–15.0)
Immature Granulocytes: 0 %
Lymphocytes Relative: 36 %
Lymphs Abs: 2.2 10*3/uL (ref 0.7–4.0)
MCH: 26.8 pg (ref 26.0–34.0)
MCHC: 32.4 g/dL (ref 30.0–36.0)
MCV: 82.6 fL (ref 80.0–100.0)
Monocytes Absolute: 0.4 10*3/uL (ref 0.1–1.0)
Monocytes Relative: 6 %
Neutro Abs: 3.4 10*3/uL (ref 1.7–7.7)
Neutrophils Relative %: 55 %
Platelets: 295 10*3/uL (ref 150–400)
RBC: 4.37 MIL/uL (ref 3.87–5.11)
RDW: 13.6 % (ref 11.5–15.5)
WBC: 6.1 10*3/uL (ref 4.0–10.5)
nRBC: 0 % (ref 0.0–0.2)

## 2021-09-21 LAB — COMPREHENSIVE METABOLIC PANEL
ALT: 108 U/L — ABNORMAL HIGH (ref 0–44)
AST: 23 U/L (ref 15–41)
Albumin: 3.7 g/dL (ref 3.5–5.0)
Alkaline Phosphatase: 79 U/L (ref 38–126)
Anion gap: 6 (ref 5–15)
BUN: 11 mg/dL (ref 6–20)
CO2: 25 mmol/L (ref 22–32)
Calcium: 8.8 mg/dL — ABNORMAL LOW (ref 8.9–10.3)
Chloride: 107 mmol/L (ref 98–111)
Creatinine, Ser: 0.92 mg/dL (ref 0.44–1.00)
GFR, Estimated: >60 mL/min (ref >60)
Glucose, Bld: 88 mg/dL (ref 70–99)
Potassium: 4 mmol/L (ref 3.5–5.1)
Sodium: 138 mmol/L (ref 135–145)
Total Bilirubin: 0.3 mg/dL (ref 0.3–1.2)
Total Protein: 7 g/dL (ref 6.5–8.1)

## 2021-09-21 LAB — HEMOGLOBIN A1C
Hgb A1c MFr Bld: 5.4 % (ref 4.8–5.6)
Mean Plasma Glucose: 108.28 mg/dL

## 2021-09-21 LAB — GLUCOSE, CAPILLARY: Glucose-Capillary: 100 mg/dL — ABNORMAL HIGH (ref 70–99)

## 2021-09-22 LAB — SARS CORONAVIRUS 2 (TAT 6-24 HRS): SARS Coronavirus 2: NEGATIVE

## 2021-09-25 ENCOUNTER — Encounter (HOSPITAL_COMMUNITY)
Admission: RE | Disposition: A | Discharge status: Home / Self Care | Payer: Self-pay | Source: Home / Self Care | Attending: General Surgery

## 2021-09-25 ENCOUNTER — Inpatient Hospital Stay (HOSPITAL_COMMUNITY)
Admission: RE | Admit: 2021-09-25 | Discharge: 2021-09-26 | DRG: 621 | Disposition: A | Discharge status: Home / Self Care | Payer: BC Managed Care – PPO | Attending: General Surgery | Admitting: General Surgery

## 2021-09-25 ENCOUNTER — Encounter (HOSPITAL_COMMUNITY): Payer: Self-pay | Admitting: General Surgery

## 2021-09-25 ENCOUNTER — Inpatient Hospital Stay (HOSPITAL_COMMUNITY): Payer: BC Managed Care – PPO | Admitting: Certified Registered Nurse Anesthetist

## 2021-09-25 ENCOUNTER — Inpatient Hospital Stay (HOSPITAL_COMMUNITY): Payer: BC Managed Care – PPO | Admitting: Physician Assistant

## 2021-09-25 DIAGNOSIS — Z7985 Long-term (current) use of injectable non-insulin antidiabetic drugs: Secondary | ICD-10-CM

## 2021-09-25 DIAGNOSIS — Z79899 Other long term (current) drug therapy: Secondary | ICD-10-CM | POA: Diagnosis not present

## 2021-09-25 DIAGNOSIS — Z01818 Encounter for other preprocedural examination: Secondary | ICD-10-CM

## 2021-09-25 DIAGNOSIS — Z720 Tobacco use: Secondary | ICD-10-CM

## 2021-09-25 DIAGNOSIS — K219 Gastro-esophageal reflux disease without esophagitis: Secondary | ICD-10-CM | POA: Diagnosis present

## 2021-09-25 DIAGNOSIS — R7303 Prediabetes: Secondary | ICD-10-CM | POA: Diagnosis present

## 2021-09-25 DIAGNOSIS — Z6841 Body Mass Index (BMI) 40.0 and over, adult: Secondary | ICD-10-CM | POA: Diagnosis not present

## 2021-09-25 DIAGNOSIS — Z9884 Bariatric surgery status: Secondary | ICD-10-CM

## 2021-09-25 HISTORY — DX: Prediabetes: R73.03

## 2021-09-25 HISTORY — PX: GASTRIC ROUX-EN-Y: SHX5262

## 2021-09-25 LAB — HEMOGLOBIN AND HEMATOCRIT, BLOOD
HCT: 38.8 % (ref 36.0–46.0)
Hemoglobin: 12 g/dL (ref 12.0–15.0)

## 2021-09-25 LAB — TYPE AND SCREEN
ABO/RH(D): A POS
Antibody Screen: NEGATIVE

## 2021-09-25 LAB — PREGNANCY, URINE: Preg Test, Ur: NEGATIVE

## 2021-09-25 LAB — ABO/RH: ABO/RH(D): A POS

## 2021-09-25 SURGERY — LAPAROSCOPIC ROUX-EN-Y GASTRIC BYPASS WITH UPPER ENDOSCOPY
Anesthesia: General | Site: Abdomen

## 2021-09-25 MED ORDER — LIDOCAINE 2% (20 MG/ML) 5 ML SYRINGE
INTRAMUSCULAR | Status: DC | PRN
  Administered 2021-09-25: 100 mg via INTRAVENOUS

## 2021-09-25 MED ORDER — FENTANYL CITRATE (PF) 100 MCG/2ML IJ SOLN
INTRAMUSCULAR | Status: DC | PRN
  Administered 2021-09-25: 100 ug via INTRAVENOUS
  Administered 2021-09-25 (×3): 50 ug via INTRAVENOUS

## 2021-09-25 MED ORDER — DEXAMETHASONE SODIUM PHOSPHATE 10 MG/ML IJ SOLN
INTRAMUSCULAR | Status: DC | PRN
  Administered 2021-09-25: 10 mg via INTRAVENOUS

## 2021-09-25 MED ORDER — DEXAMETHASONE SODIUM PHOSPHATE 10 MG/ML IJ SOLN
INTRAMUSCULAR | Status: AC
  Filled 2021-09-25: qty 1

## 2021-09-25 MED ORDER — DIPHENHYDRAMINE HCL 50 MG/ML IJ SOLN: 12.5000 mg | Freq: Three times a day (TID) | INTRAMUSCULAR | Status: DC | PRN

## 2021-09-25 MED ORDER — ACETAMINOPHEN 500 MG PO TABS
1000.0000 mg | ORAL_TABLET | ORAL | Status: AC
  Administered 2021-09-25: 1000 mg via ORAL

## 2021-09-25 MED ORDER — FENTANYL CITRATE PF 50 MCG/ML IJ SOSY
25.0000 ug | PREFILLED_SYRINGE | INTRAMUSCULAR | Status: DC | PRN
  Administered 2021-09-25 (×2): 25 ug via INTRAVENOUS
  Administered 2021-09-25: 50 ug via INTRAVENOUS

## 2021-09-25 MED ORDER — LIP MEDEX EX OINT
TOPICAL_OINTMENT | CUTANEOUS | Status: AC
  Filled 2021-09-25: qty 7

## 2021-09-25 MED ORDER — SODIUM CHLORIDE (PF) 0.9 % IJ SOLN
INTRAMUSCULAR | Status: DC | PRN
  Administered 2021-09-25: 50 mL

## 2021-09-25 MED ORDER — DEXAMETHASONE SODIUM PHOSPHATE 4 MG/ML IJ SOLN: 4.0000 mg | INTRAMUSCULAR | Status: DC

## 2021-09-25 MED ORDER — LACTATED RINGERS IR SOLN
Status: DC | PRN
  Administered 2021-09-25: 1000 mL

## 2021-09-25 MED ORDER — OXYCODONE HCL 5 MG/5ML PO SOLN
5.0000 mg | Freq: Four times a day (QID) | ORAL | Status: DC | PRN
  Administered 2021-09-25 – 2021-09-26 (×3): 5 mg via ORAL
  Filled 2021-09-25 (×4): qty 5

## 2021-09-25 MED ORDER — ONDANSETRON HCL 4 MG/2ML IJ SOLN
INTRAMUSCULAR | Status: DC | PRN
  Administered 2021-09-25: 4 mg via INTRAVENOUS

## 2021-09-25 MED ORDER — 0.9 % SODIUM CHLORIDE (POUR BTL) OPTIME
TOPICAL | Status: DC | PRN
  Administered 2021-09-25: 1000 mL

## 2021-09-25 MED ORDER — LACTATED RINGERS IV SOLN: INTRAVENOUS | Status: DC

## 2021-09-25 MED ORDER — FENTANYL CITRATE (PF) 250 MCG/5ML IJ SOLN
INTRAMUSCULAR | Status: AC
  Filled 2021-09-25: qty 5

## 2021-09-25 MED ORDER — APREPITANT 40 MG PO CAPS
40.0000 mg | ORAL_CAPSULE | ORAL | Status: AC
  Administered 2021-09-25: 40 mg via ORAL
  Filled 2021-09-25: qty 1

## 2021-09-25 MED ORDER — ROCURONIUM BROMIDE 10 MG/ML (PF) SYRINGE
PREFILLED_SYRINGE | INTRAVENOUS | Status: AC
  Filled 2021-09-25: qty 10

## 2021-09-25 MED ORDER — ONDANSETRON HCL 4 MG/2ML IJ SOLN: 4.0000 mg | Freq: Once | INTRAMUSCULAR | Status: DC | PRN

## 2021-09-25 MED ORDER — SODIUM CHLORIDE 0.9 % IV SOLN
2.0000 g | INTRAVENOUS | Status: AC
  Administered 2021-09-25: 2 g via INTRAVENOUS
  Filled 2021-09-25: qty 2

## 2021-09-25 MED ORDER — AMISULPRIDE (ANTIEMETIC) 5 MG/2ML IV SOLN: 10.0000 mg | Freq: Once | INTRAVENOUS | Status: DC | PRN

## 2021-09-25 MED ORDER — ACETAMINOPHEN 500 MG PO TABS
1000.0000 mg | ORAL_TABLET | Freq: Three times a day (TID) | ORAL | Status: DC
  Administered 2021-09-25 – 2021-09-26 (×3): 1000 mg via ORAL
  Filled 2021-09-25 (×3): qty 2

## 2021-09-25 MED ORDER — PHENYLEPHRINE 40 MCG/ML (10ML) SYRINGE FOR IV PUSH (FOR BLOOD PRESSURE SUPPORT)
PREFILLED_SYRINGE | INTRAVENOUS | Status: AC
  Filled 2021-09-25: qty 10

## 2021-09-25 MED ORDER — SIMETHICONE 80 MG PO CHEW: 80.0000 mg | CHEWABLE_TABLET | Freq: Four times a day (QID) | ORAL | Status: DC | PRN

## 2021-09-25 MED ORDER — ONDANSETRON HCL 4 MG/2ML IJ SOLN
4.0000 mg | Freq: Four times a day (QID) | INTRAMUSCULAR | Status: DC | PRN
  Administered 2021-09-26 (×2): 4 mg via INTRAVENOUS
  Filled 2021-09-25 (×2): qty 2

## 2021-09-25 MED ORDER — MIDAZOLAM HCL 2 MG/2ML IJ SOLN
INTRAMUSCULAR | Status: DC | PRN
  Administered 2021-09-25: 2 mg via INTRAVENOUS

## 2021-09-25 MED ORDER — SCOPOLAMINE 1 MG/3DAYS TD PT72
1.0000 | MEDICATED_PATCH | TRANSDERMAL | Status: DC
  Administered 2021-09-25: 1.5 mg via TRANSDERMAL
  Filled 2021-09-25: qty 1

## 2021-09-25 MED ORDER — POTASSIUM CHLORIDE IN NACL 20-0.45 MEQ/L-% IV SOLN
INTRAVENOUS | Status: DC
  Filled 2021-09-25 (×4): qty 1000

## 2021-09-25 MED ORDER — BUPIVACAINE LIPOSOME 1.3 % IJ SUSP
INTRAMUSCULAR | Status: AC
  Filled 2021-09-25: qty 20

## 2021-09-25 MED ORDER — "VISTASEAL 4 ML SINGLE DOSE KIT "
4.0000 mL | PACK | Freq: Once | CUTANEOUS | Status: AC
  Administered 2021-09-25: 4 mL via TOPICAL
  Filled 2021-09-25: qty 4

## 2021-09-25 MED ORDER — OXYCODONE HCL 5 MG/5ML PO SOLN: 5.0000 mg | Freq: Once | ORAL | Status: DC | PRN

## 2021-09-25 MED ORDER — CHLORHEXIDINE GLUCONATE 4 % EX LIQD: 60.0000 mL | Freq: Once | CUTANEOUS | Status: DC

## 2021-09-25 MED ORDER — ACETAMINOPHEN 160 MG/5ML PO SOLN: 1000.0000 mg | Freq: Three times a day (TID) | ORAL | Status: DC

## 2021-09-25 MED ORDER — GABAPENTIN 300 MG PO CAPS
300.0000 mg | ORAL_CAPSULE | ORAL | Status: AC
  Administered 2021-09-25: 300 mg via ORAL
  Filled 2021-09-25: qty 1

## 2021-09-25 MED ORDER — ROCURONIUM BROMIDE 10 MG/ML (PF) SYRINGE
PREFILLED_SYRINGE | INTRAVENOUS | Status: DC | PRN
  Administered 2021-09-25: 100 mg via INTRAVENOUS
  Administered 2021-09-25: 20 mg via INTRAVENOUS

## 2021-09-25 MED ORDER — ONDANSETRON HCL 4 MG/2ML IJ SOLN
INTRAMUSCULAR | Status: AC
  Filled 2021-09-25: qty 2

## 2021-09-25 MED ORDER — LIDOCAINE HCL (PF) 2 % IJ SOLN: INTRAMUSCULAR | Status: DC | PRN

## 2021-09-25 MED ORDER — MIDAZOLAM HCL 2 MG/2ML IJ SOLN
INTRAMUSCULAR | Status: AC
  Filled 2021-09-25: qty 2

## 2021-09-25 MED ORDER — BUPIVACAINE LIPOSOME 1.3 % IJ SUSP: 20.0000 mL | Freq: Once | INTRAMUSCULAR | Status: DC

## 2021-09-25 MED ORDER — ENOXAPARIN SODIUM 30 MG/0.3ML IJ SOSY
30.0000 mg | PREFILLED_SYRINGE | Freq: Two times a day (BID) | INTRAMUSCULAR | Status: DC
  Administered 2021-09-26 (×2): 30 mg via SUBCUTANEOUS
  Filled 2021-09-25 (×2): qty 0.3

## 2021-09-25 MED ORDER — HYDRALAZINE HCL 20 MG/ML IJ SOLN: 10.0000 mg | INTRAMUSCULAR | Status: DC | PRN

## 2021-09-25 MED ORDER — SODIUM CHLORIDE 0.9 % IV SOLN
12.5000 mg | Freq: Four times a day (QID) | INTRAVENOUS | Status: DC | PRN
  Filled 2021-09-25: qty 0.5

## 2021-09-25 MED ORDER — SUGAMMADEX SODIUM 200 MG/2ML IV SOLN
INTRAVENOUS | Status: DC | PRN
  Administered 2021-09-25: 300 mg via INTRAVENOUS

## 2021-09-25 MED ORDER — LIDOCAINE HCL (PF) 2 % IJ SOLN
INTRAMUSCULAR | Status: AC
  Filled 2021-09-25: qty 5

## 2021-09-25 MED ORDER — LIDOCAINE HCL (PF) 2 % IJ SOLN
INTRAMUSCULAR | Status: AC
  Filled 2021-09-25: qty 10

## 2021-09-25 MED ORDER — STERILE WATER FOR IRRIGATION IR SOLN
Status: DC | PRN
  Administered 2021-09-25: 1000 mL

## 2021-09-25 MED ORDER — LIDOCAINE HCL (PF) 2 % IJ SOLN
INTRAMUSCULAR | Status: DC | PRN
  Administered 2021-09-25: 1.5 mg/kg/h

## 2021-09-25 MED ORDER — HEPARIN SODIUM (PORCINE) 5000 UNIT/ML IJ SOLN
5000.0000 [IU] | INTRAMUSCULAR | Status: AC
  Administered 2021-09-25: 5000 [IU] via SUBCUTANEOUS
  Filled 2021-09-25: qty 1

## 2021-09-25 MED ORDER — ENSURE MAX PROTEIN PO LIQD
2.0000 [oz_av] | ORAL | Status: DC
  Administered 2021-09-26 (×3): 2 [oz_av] via ORAL

## 2021-09-25 MED ORDER — OXYCODONE HCL 5 MG PO TABS: 5.0000 mg | ORAL_TABLET | Freq: Once | ORAL | Status: DC | PRN

## 2021-09-25 MED ORDER — ACETAMINOPHEN 500 MG PO TABS
1000.0000 mg | ORAL_TABLET | Freq: Once | ORAL | Status: DC
  Filled 2021-09-25: qty 2

## 2021-09-25 MED ORDER — FENTANYL CITRATE PF 50 MCG/ML IJ SOSY
PREFILLED_SYRINGE | INTRAMUSCULAR | Status: AC
  Filled 2021-09-25: qty 2

## 2021-09-25 MED ORDER — BUPIVACAINE LIPOSOME 1.3 % IJ SUSP
INTRAMUSCULAR | Status: DC | PRN
  Administered 2021-09-25: 20 mL

## 2021-09-25 MED ORDER — FIBRIN SEALANT 2 ML SINGLE DOSE KIT
2.0000 mL | PACK | Freq: Once | CUTANEOUS | Status: AC
  Administered 2021-09-25: 2 mL via TOPICAL
  Filled 2021-09-25: qty 2

## 2021-09-25 MED ORDER — PANTOPRAZOLE SODIUM 40 MG IV SOLR
40.0000 mg | Freq: Every day | INTRAVENOUS | Status: DC
  Administered 2021-09-25: 40 mg via INTRAVENOUS
  Filled 2021-09-25: qty 40

## 2021-09-25 MED ORDER — KETOTIFEN FUMARATE 0.025 % OP SOLN
1.0000 [drp] | Freq: Every day | OPHTHALMIC | Status: DC | PRN
Start: 2021-09-25 — End: 2021-09-26

## 2021-09-25 MED ORDER — PHENYLEPHRINE 40 MCG/ML (10ML) SYRINGE FOR IV PUSH (FOR BLOOD PRESSURE SUPPORT)
PREFILLED_SYRINGE | INTRAVENOUS | Status: DC | PRN
  Administered 2021-09-25 (×2): 80 ug via INTRAVENOUS
  Administered 2021-09-25: 120 ug via INTRAVENOUS

## 2021-09-25 MED ORDER — MORPHINE SULFATE (PF) 2 MG/ML IV SOLN: 1.0000 mg | INTRAVENOUS | Status: DC | PRN

## 2021-09-25 MED ORDER — PROPOFOL 10 MG/ML IV BOLUS
INTRAVENOUS | Status: DC | PRN
  Administered 2021-09-25: 200 mg via INTRAVENOUS

## 2021-09-25 SURGICAL SUPPLY — 80 items
APPLICATOR COTTON TIP 6 STRL (MISCELLANEOUS) IMPLANT
APPLICATOR COTTON TIP 6IN STRL (MISCELLANEOUS)
APPLICATOR VISTASEAL 35 (MISCELLANEOUS) ×4 IMPLANT
APPLIER CLIP ROT 13.4 12 LRG (CLIP)
BAG COUNTER SPONGE SURGICOUNT (BAG) IMPLANT
BLADE SURG SZ11 CARB STEEL (BLADE) ×2 IMPLANT
BNDG ADH 1X3 SHEER STRL LF (GAUZE/BANDAGES/DRESSINGS) IMPLANT
CABLE HIGH FREQUENCY MONO STRZ (ELECTRODE) IMPLANT
CHLORAPREP W/TINT 26 (MISCELLANEOUS) ×4 IMPLANT
CLIP APPLIE ROT 13.4 12 LRG (CLIP) IMPLANT
CLIP SUT LAPRA TY ABSORB (SUTURE) ×4 IMPLANT
CUTTER FLEX LINEAR 45M (STAPLE) IMPLANT
DEVICE SUT QUICK LOAD TK 5 (STAPLE) IMPLANT
DEVICE SUT TI-KNOT TK 5X26 (MISCELLANEOUS) IMPLANT
DEVICE SUTURE ENDOST 10MM (ENDOMECHANICALS) ×2 IMPLANT
DRAIN PENROSE 0.25X18 (DRAIN) ×2 IMPLANT
DRSG TEGADERM 2-3/8X2-3/4 SM (GAUZE/BANDAGES/DRESSINGS) ×2 IMPLANT
ELECT REM PT RETURN 15FT ADLT (MISCELLANEOUS) ×2 IMPLANT
GAUZE 4X4 16PLY ~~LOC~~+RFID DBL (SPONGE) ×2 IMPLANT
GAUZE SPONGE 2X2 8PLY STRL LF (GAUZE/BANDAGES/DRESSINGS) ×1 IMPLANT
GAUZE SPONGE 4X4 12PLY STRL (GAUZE/BANDAGES/DRESSINGS) IMPLANT
GLOVE SRG 8 PF TXTR STRL LF DI (GLOVE) ×1 IMPLANT
GLOVE SURG POLY ORTHO LF SZ7.5 (GLOVE) ×2 IMPLANT
GLOVE SURG UNDER POLY LF SZ8 (GLOVE) ×2
GOWN STRL REUS W/TWL XL LVL3 (GOWN DISPOSABLE) ×8 IMPLANT
IRRIG SUCT STRYKERFLOW 2 WTIP (MISCELLANEOUS) ×2
IRRIGATION SUCT STRKRFLW 2 WTP (MISCELLANEOUS) ×1 IMPLANT
KIT BASIN OR (CUSTOM PROCEDURE TRAY) ×2 IMPLANT
KIT GASTRIC LAVAGE 34FR ADT (SET/KITS/TRAYS/PACK) ×1 IMPLANT
KIT TURNOVER KIT A (KITS) IMPLANT
MARKER SKIN DUAL TIP RULER LAB (MISCELLANEOUS) ×2 IMPLANT
MAT PREVALON FULL STRYKER (MISCELLANEOUS) ×2 IMPLANT
NDL SPNL 22GX3.5 QUINCKE BK (NEEDLE) ×1 IMPLANT
NEEDLE SPNL 22GX3.5 QUINCKE BK (NEEDLE) ×2 IMPLANT
PACK CARDIOVASCULAR III (CUSTOM PROCEDURE TRAY) ×2 IMPLANT
PENCIL SMOKE EVACUATOR (MISCELLANEOUS) IMPLANT
RELOAD 45 VASCULAR/THIN (ENDOMECHANICALS) IMPLANT
RELOAD ENDO STITCH 2.0 (ENDOMECHANICALS) ×18
RELOAD STAPLE 45 2.5 WHT GRN (ENDOMECHANICALS) IMPLANT
RELOAD STAPLE 45 3.5 BLU ETS (ENDOMECHANICALS) IMPLANT
RELOAD STAPLE 60 2.6 WHT THN (STAPLE) ×2 IMPLANT
RELOAD STAPLE 60 3.6 BLU REG (STAPLE) ×2 IMPLANT
RELOAD STAPLE 60 3.8 GOLD REG (STAPLE) ×1 IMPLANT
RELOAD STAPLE TA45 3.5 REG BLU (ENDOMECHANICALS) IMPLANT
RELOAD STAPLER BLUE 60MM (STAPLE) ×4 IMPLANT
RELOAD STAPLER GOLD 60MM (STAPLE) ×1 IMPLANT
RELOAD STAPLER WHITE 60MM (STAPLE) ×2 IMPLANT
RELOAD SUT SNGL STCH ABSRB 2-0 (ENDOMECHANICALS) ×5 IMPLANT
RELOAD SUT SNGL STCH BLK 2-0 (ENDOMECHANICALS) ×4 IMPLANT
SCISSORS LAP 5X45 EPIX DISP (ENDOMECHANICALS) ×2 IMPLANT
SET TUBE SMOKE EVAC HIGH FLOW (TUBING) ×2 IMPLANT
SHEARS HARMONIC ACE PLUS 45CM (MISCELLANEOUS) ×2 IMPLANT
SLEEVE XCEL OPT CAN 5 100 (ENDOMECHANICALS) ×2 IMPLANT
SOL ANTI FOG 6CC (MISCELLANEOUS) ×1 IMPLANT
SOLUTION ANTI FOG 6CC (MISCELLANEOUS) ×1
SPONGE GAUZE 2X2 STER 10/PKG (GAUZE/BANDAGES/DRESSINGS) ×1
STAPLER ECHELON BIOABSB 60 FLE (MISCELLANEOUS) IMPLANT
STAPLER ECHELON LONG 60 440 (INSTRUMENTS) ×2 IMPLANT
STAPLER RELOAD BLUE 60MM (STAPLE) ×8
STAPLER RELOAD GOLD 60MM (STAPLE) ×2
STAPLER RELOAD WHITE 60MM (STAPLE) ×4
STRIP CLOSURE SKIN 1/2X4 (GAUZE/BANDAGES/DRESSINGS) ×12 IMPLANT
SURGILUBE 2OZ TUBE FLIPTOP (MISCELLANEOUS) ×2 IMPLANT
SUT MNCRL AB 4-0 PS2 18 (SUTURE) ×2 IMPLANT
SUT RELOAD ENDO STITCH 2 48X1 (ENDOMECHANICALS) ×5
SUT RELOAD ENDO STITCH 2.0 (ENDOMECHANICALS) ×4
SUT SURGIDAC NAB ES-9 0 48 120 (SUTURE) IMPLANT
SUT VIC AB 2-0 SH 27 (SUTURE) ×2
SUT VIC AB 2-0 SH 27X BRD (SUTURE) ×1 IMPLANT
SUTURE RELOAD END STTCH 2 48X1 (ENDOMECHANICALS) ×5 IMPLANT
SUTURE RELOAD ENDO STITCH 2.0 (ENDOMECHANICALS) ×4 IMPLANT
SYR 20ML LL LF (SYRINGE) ×4 IMPLANT
TOWEL OR 17X26 10 PK STRL BLUE (TOWEL DISPOSABLE) ×2 IMPLANT
TOWEL OR NON WOVEN STRL DISP B (DISPOSABLE) ×2 IMPLANT
TRAY FOLEY MTR SLVR 16FR STAT (SET/KITS/TRAYS/PACK) IMPLANT
TROCAR BLADELESS OPT 5 100 (ENDOMECHANICALS) ×2 IMPLANT
TROCAR UNIVERSAL OPT 12M 100M (ENDOMECHANICALS) ×6 IMPLANT
TROCAR XCEL 12X100 BLDLESS (ENDOMECHANICALS) ×2 IMPLANT
TUBE CALIBRATION LAPBAND (TUBING) ×1 IMPLANT
TUBING CONNECTING 10 (TUBING) ×4 IMPLANT

## 2021-09-25 NOTE — Progress Notes (Signed)
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
## 2021-09-25 NOTE — Transfer of Care (Signed)
Immediate Anesthesia Transfer of Care Note  Patient: Christel Chermak  Procedure(s) Performed: LAPAROSCOPIC ROUX-EN-Y GASTRIC BYPASS WITH UPPER ENDOSCOPY (Abdomen)  Patient Location: PACU  Anesthesia Type:General  Level of Consciousness: drowsy and patient cooperative  Airway & Oxygen Therapy: Patient Spontanous Breathing and Patient connected to face mask oxygen  Post-op Assessment: Report given to RN and Post -op Vital signs reviewed and stable  Post vital signs: Reviewed and stable  Last Vitals:  Vitals Value Taken Time  BP 125/65 09/25/21 1536  Temp    Pulse 88 09/25/21 1537  Resp 20 09/25/21 1537  SpO2 95 % 09/25/21 1537  Vitals shown include unvalidated device data.  Last Pain:  Vitals:   09/25/21 0926  TempSrc: Oral         Complications: No notable events documented.

## 2021-09-25 NOTE — Interval H&P Note (Signed)
History and Physical Interval Note:  09/25/2021 12:36 PM  Tracy Morris  has presented today for surgery, with the diagnosis of MORBID OBESITY.  The various methods of treatment have been discussed with the patient and family. After consideration of risks, benefits and other options for treatment, the patient has consented to  Procedure(s): LAPAROSCOPIC ROUX-EN-Y GASTRIC BYPASS WITH UPPER ENDOSCOPY (N/A) as a surgical intervention.  The patient's history has been reviewed, patient examined, no change in status, stable for surgery.  I have reviewed the patient's chart and labs.  Questions were answered to the patient's satisfaction.     [name]

## 2021-09-25 NOTE — Anesthesia Procedure Notes (Signed)
Procedure Name: Intubation Date/Time: 09/25/2021 1:18 PM Performed by: Genelle Bal, CRNA Pre-anesthesia Checklist: Patient identified, Emergency Drugs available, Suction available and Patient being monitored Patient Re-evaluated:Patient Re-evaluated prior to induction Oxygen Delivery Method: Circle system utilized Preoxygenation: Pre-oxygenation with 100% oxygen Induction Type: IV induction Ventilation: Oral airway inserted - appropriate to patient size and Mask ventilation without difficulty Laryngoscope Size: Miller and 2 Grade View: Grade III Tube type: Oral Tube size: 7.0 mm Number of attempts: 1 Airway Equipment and Method: Stylet and Oral airway Placement Confirmation: ETT inserted through vocal cords under direct vision, positive ETCO2 and breath sounds checked- equal and bilateral Secured at: 22 cm Tube secured with: Tape Dental Injury: Teeth and Oropharynx as per pre-operative assessment

## 2021-09-25 NOTE — Anesthesia Preprocedure Evaluation (Signed)
Anesthesia Evaluation  Patient identified by MRN, date of birth, ID band Patient awake    Reviewed: Allergy & Precautions, NPO status , Patient's Chart, lab work & pertinent test results  History of Anesthesia Complications Negative for: history of anesthetic complications  Airway Mallampati: II  TM Distance: >3 FB Neck ROM: Full    Dental  (+) Dental Advisory Given, Teeth Intact   Pulmonary neg pulmonary ROS,    Pulmonary exam normal        Cardiovascular negative cardio ROS Normal cardiovascular exam     Neuro/Psych negative neurological ROS     GI/Hepatic Neg liver ROS, GERD  ,  Endo/Other  diabetes (pre-DM)Morbid obesity  Renal/GU negative Renal ROS  negative genitourinary   Musculoskeletal negative musculoskeletal ROS (+)   Abdominal   Peds  Hematology  (+) anemia , Hgb 11.7   Anesthesia Other Findings   Reproductive/Obstetrics                            Anesthesia Physical Anesthesia Plan  ASA: 3  Anesthesia Plan: General   Post-op Pain Management: Toradol IV (intra-op), Tylenol PO (pre-op) and Lidocaine infusion   Induction: Intravenous  PONV Risk Score and Plan: 4 or greater and Ondansetron, Dexamethasone, Treatment may vary due to age or medical condition, Midazolam and Scopolamine patch - Pre-op  Airway Management Planned: Oral ETT  Additional Equipment: None  Intra-op Plan:   Post-operative Plan: Extubation in OR  Informed Consent: I have reviewed the patients History and Physical, chart, labs and discussed the procedure including the risks, benefits and alternatives for the proposed anesthesia with the patient or authorized representative who has indicated his/her understanding and acceptance.     Dental advisory given  Plan Discussed with:   Anesthesia Plan Comments:         Anesthesia Quick Evaluation

## 2021-09-25 NOTE — Op Note (Signed)
Preoperative diagnosis: Roux-en-Y gastric bypass  Postoperative diagnosis: Same   Procedure: Upper endoscopy   Surgeon: Clovis Riley, M.D.  Anesthesia: Gen.   Description of procedure: The endoscope was placed in the mouth and oropharynx and under endoscopic vision it was advanced to the esophagogastric junction which was identified at 35cm from the teeth.  The pouch was tensely insufflated while the upper abdomen was flooded with irrigation  and the roux limb clamped to perform a leak test, which was negative. No bubbles were seen.  The staple line was hemostatic and the anastomosis was visibly patent, measuring 40cm from the teeth. The lumen was decompressed and the scope was withdrawn without difficulty.    Clovis Riley, M.D. General, Bariatric, & Minimally Invasive Surgery [facility]

## 2021-09-25 NOTE — Anesthesia Postprocedure Evaluation (Signed)
Anesthesia Post Note  Patient: Jamaria Mapel  Procedure(s) Performed: LAPAROSCOPIC ROUX-EN-Y GASTRIC BYPASS WITH UPPER ENDOSCOPY (Abdomen)     Patient location during evaluation: PACU Anesthesia Type: General Level of consciousness: awake and alert Pain management: pain level controlled Vital Signs Assessment: post-procedure vital signs reviewed and stable Respiratory status: spontaneous breathing, nonlabored ventilation and respiratory function stable Cardiovascular status: blood pressure returned to baseline and stable Postop Assessment: no apparent nausea or vomiting Anesthetic complications: no   No notable events documented.  Last Vitals:  Vitals:   09/25/21 1615 09/25/21 1630  BP: 133/79 132/79  Pulse: 71 89  Resp: 19 18  Temp:  36.7 C  SpO2: 96% 97%    Last Pain:  Vitals:   09/25/21 1630  TempSrc:   PainSc: [place] E Billye Pickerel

## 2021-09-25 NOTE — H&P (Signed)
DATE OF ENCOUNTER: 09/13/2021 Subjective   Chief Complaint: pre op for bari sx   History of Present Illness: Tracy Morris is a 35 y.o. female who is seen today for long-term follow-up regarding her severe obesity, GERD, and prediabetes. I initially met her in August to discuss bariatric surgery. Her weight at that time was essentially 295 pounds. She has completed the bariatric surgery evaluation process she denies any changes since I initially met her. She denies any trips to the emergency room or hospital. Her chest x-ray and EKG were on remarkable. She does have prediabetes and GERD symptoms. Her upper GI showed marked to moderate reflux. As such she has decided to switch to a gastric bypass.  She denies any chest pain, chest pressure, shortness of breath, dyspnea on exertion, TIAs or angina. No tobacco use. She denies any surgery since I last saw her. No blood thinners. She is doing once a week Ozempic and has lost about 6 pounds. She has received nutritional and psychological evaluation and clearance..  Review of Systems: A complete review of systems was obtained from the patient. I have reviewed this information and discussed as appropriate with the patient. See HPI as well for other ROS.  ROS   Medical History: Past Medical History:  Diagnosis Date   GERD (gastroesophageal reflux disease)   Prediabetes   There is no problem list on file for this patient.  Past Surgical History:  Procedure Laterality Date   tummy tuck    No Known Allergies  Current Outpatient Medications on File Prior to Visit  Medication Sig Dispense Refill   norethindrone (MICRONOR) 0.35 mg tablet Jencycla 0.35 mg tablet TAKE 1 TABLET BY MOUTH ONCE DAILY   OZEMPIC pen injector INJECT 1MG SUBCUTANEOUS EVERY WEEK   prochlorperazine (COMPAZINE) 10 MG tablet TAKE 1 TABLET BY MOUTH TWICE A DAY FOR MIGRAINE. TAKE WITH BENADRYL   WEGOVY 0.5 mg/0.5 mL pen injector INJECT 0.5MG INTO THE SKIN ONCE A WEEK   No  current facility-administered medications on file prior to visit.   History reviewed. No pertinent family history.   Social History   Tobacco Use  Smoking Status Never  Smokeless Tobacco Never    Social History   Socioeconomic History   Marital status: Single  Tobacco Use   Smoking status: Never   Smokeless tobacco: Never  Vaping Use   Vaping Use: Never used  Substance and Sexual Activity   Alcohol use: Yes   Drug use: Never   Objective:   Vitals:  09/13/21 0828  BP: 128/82  Pulse: 104  Weight: (!) 130.6 kg (288 lb)  Height: 161.3 cm (5' 3.5")   Body mass index is 50.22 kg/m.  Gen: alert, NAD, non-toxic appearing, severe obesity Pupils: equal, no scleral icterus Pulm: Lungs clear to auscultation, symmetric chest rise CV: regular rate and rhythm Abd: soft, nontender, nondistended. No cellulitis. No incisional hernia Ext: no edema,  Skin: no rash, no jaundice  Labs, Imaging and Diagnostic Testing:  Documented above.   Assessment and Plan:  Diagnoses and all orders for this visit:  Severe obesity (CMS-HCC)  Prediabetes  Gastroesophageal reflux disease, unspecified whether esophagitis present    The patient meets weight loss surgery criteria. I think the patient would be an acceptable candidate for Laparoscopic Roux-en-Y Gastric bypass.   Since she has GERD symptoms and her upper GI showed moderate reflux she decided to switch to a gastric bypass which I think is very appropriate given the potential worsening of reflux after sleeve  gastrectomy.  We discussed laparoscopic Roux-en-Y gastric bypass. We discussed the preoperative, operative and postoperative process. Using diagrams, I explained the surgery in detail including the performance of an EGD near the end of the surgery. We discussed the typical hospital course including a 2-3 day stay baring any complications. The patient was given educational material. I quoted the patient that they can expect to lose  50-70% of their excess weight with the gastric bypass. We did discuss the possibility of weight regain several years after the procedure.  We discussed the risk and benefits of surgery including but not limited to anesthesia risk, bleeding, infection, anastomotic edema requiring a few additional days in the hospital, postop nausea, possible conversion to open procedure, blood clot formation, anastomotic leak, anastomotic stricture, ulcer formation, death, respiratory complications, intestinal blockage, internal hernia, gallstone formation, vitamin and nutritional deficiencies, hair loss, weight regain injury to surrounding structures, failure to lose weight and mood changes.  We discussed that before and after surgery that there would be an alteration in their diet. I explained that we have put them on a diet 2 weeks before surgery. I also explained that they would be on a liquid diet for 2 weeks after surgery. We discussed that they would have to avoid certain foods such as sugar after surgery. We discussed the importance of physical activity as well as compliance with our dietary and supplement recommendations and routine follow-up.  This patient encounter took 25 minutes today to perform the following: take history, perform exam, review outside records, interpret imaging, counsel the patient on their diagnosis and document encounter, findings & plan in the EHR  No follow-ups on file.  [name]. Ezekeil Bethel MD FACS General, Minimally Invasive, & Bariatric Surgery

## 2021-09-25 NOTE — Op Note (Signed)
Tracy Morris [id number] 01-Nov-1985. 09/25/2021  Preoperative diagnosis:  Severe obesity (BMI 52)  Prediabetes  Gastroesophageal reflux disease, unspecified whether esophagitis present  Postoperative  diagnosis:  1. same  Surgical procedure: Laparoscopic Roux-en-Y gastric bypass (ante-colic, ante-gastric); upper endoscopy  Surgeon: Gayland Curry, M.D. FACS  Asst.: Romana Juniper MD FACS  Anesthesia: General plus exparel/marcaine mix  Complications: None   EBL: Minimal   Drains: None   Disposition: PACU in good condition   Indications for procedure: 35 y.o. yo female with morbid obesity who has been unsuccessful at sustained weight loss. The patient's comorbidities are listed above. We discussed the risk and benefits of surgery including but not limited to anesthesia risk, bleeding, infection, blood clot formation, anastomotic leak, anastomotic stricture, ulcer formation, death, respiratory complications, intestinal blockage, internal hernia, gallstone formation, vitamin and nutritional deficiencies, injury to surrounding structures, failure to lose weight and mood changes.   Description of procedure: Patient is brought to the operating room and general anesthesia induced. The patient had received preoperative broad-spectrum IV antibiotics and subcutaneous heparin. The abdomen was widely sterilely prepped with Chloraprep and draped. Patient timeout was performed and correct patient and procedure confirmed. Access was obtained with a 12 mm Optiview trocar in the left upper quadrant and pneumoperitoneum established without difficulty. Under direct vision 12 mm trocars were placed laterally in the right upper quadrant, right upper quadrant midclavicular line, and to the left and above the umbilicus for the camera port. A 5 mm trocar was placed laterally in the left upper quadrant.  Exparel/marcaine mix was infiltrated in bilateral lateral abdominal walls as a TAP block for postoperative  pain relief.  The patient had had a prior abdominoplasty so there was not great distention of the abdominal wall but we had enough working room in order to do the case. The omentum was brought into the upper abdomen and the transverse mesocolon elevated and the ligament of Treitz clearly identified. A 50 cm biliopancreatic limb was then carefully measured from the ligament of Treitz. The small intestine was divided at this point with a single firing of the white load linear stapler. A Penrose drain was sutured to the end of the Roux-en-Y limb for later identification. A 100 cm Roux-en-Y limb was then carefully measured. At this point a side-to-side anastomosis was created between the Roux limb and the end of the biliopancreatic limb. This was accomplished with a single firing of the 60 mm white load linear stapler. The common enterotomy was closed with a running 2-0 Vicryl begun at either end of the enterotomy and tied centrally.  Vistaseal tissue sealant was placed over the anastomosis. The mesenteric defect was then closed with running 2-0 silk. The omentum was then divided with the harmonic scalpel up towards the transverse colon to allow mobility of the Roux limb toward the gastric pouch. The patient was then placed in steep reversed Trendelenburg. Through a 5 mm subxiphoid site the [facility] retractor was placed and the left lobe of the liver elevated with excellent exposure of the upper stomach and hiatus. The angle of Hiss was then mobilized with the harmonic scalpel. A 5 cm gastric pouch was then carefully measured along the lesser curve of the stomach. Dissection was carried along the lesser curve at this point with the Harmonic scalpel working carefully back toward the lesser sac at right angles to the lesser curve. The free lesser sac was then entered. After being sure all tubes were removed from the stomach an initial firing of the  gold load 60 mm linear stapler was fired at right angles across the  lesser curve for about 4 cm. The gastric pouch was further mobilized posteriorly and then the pouch was completed with 3 further firings of the 60 mm blue load linear stapler up through the previously dissected angle of His. It was ensured that the pouch was completely mobilized away from the gastric remnant. This created a nice tubular 5 cm gastric pouch. The Roux limb was then brought up in an antecolic fashion with the candycane facing to the patient's left without undue tension. The gastrojejunostomy was created with an initial posterior row of 2-0 Vicryl between the Roux limb and the staple line of the gastric pouch. Enterotomies were then made in the gastric pouch and the Roux limb with the harmonic scalpel and at approximately 2-2-1/2 cm anastomosis was created with a single firing of the 72mm blue load linear stapler. The staple line was inspected and was intact without bleeding. The common enterotomy was then closed with running 2-0 Vicryl begun at either end and tied centrally. The calibration tube was then easily passed through the anastomosis and an outer anterior layer of running 2-0 Vicryl was placed. The calibration tube was removed. With the outlet of the gastrojejunostomy clamped and under saline irrigation the assistant performed upper endoscopy and with the gastric pouch tensely distended with air-there was no evidence of leak on this test. The pouch was desufflated. The Terance Hart defect was closed with running 2-0 silk. The abdomen was inspected for any evidence of bleeding or bowel injury and everything looked fine. The Nathanson retractor was removed under direct vision after coating the anastomosis with Vistaseal tissue sealant. All CO2 was evacuated and trochars removed. Skin incisions were closed with 4-0 monocryl in a subcuticular fashion followed by Dermabond. Sponge needle and instrument counts were correct. The patient was taken to the PACU in good condition.    [name]. Redmond Pulling, MD,  FACS General, Bariatric, & Minimally Invasive Surgery [facility], [place]

## 2021-09-26 ENCOUNTER — Encounter (HOSPITAL_COMMUNITY): Payer: Self-pay | Admitting: General Surgery

## 2021-09-26 ENCOUNTER — Other Ambulatory Visit (HOSPITAL_COMMUNITY): Payer: Self-pay

## 2021-09-26 LAB — COMPREHENSIVE METABOLIC PANEL
ALT: 38 U/L (ref 0–44)
AST: 14 U/L — ABNORMAL LOW (ref 15–41)
Albumin: 3.4 g/dL — ABNORMAL LOW (ref 3.5–5.0)
Alkaline Phosphatase: 58 U/L (ref 38–126)
Anion gap: 8 (ref 5–15)
BUN: 10 mg/dL (ref 6–20)
CO2: 26 mmol/L (ref 22–32)
Calcium: 8.8 mg/dL — ABNORMAL LOW (ref 8.9–10.3)
Chloride: 102 mmol/L (ref 98–111)
Creatinine, Ser: 1 mg/dL (ref 0.44–1.00)
GFR, Estimated: >60 mL/min (ref >60)
Glucose, Bld: 129 mg/dL — ABNORMAL HIGH (ref 70–99)
Potassium: 4.9 mmol/L (ref 3.5–5.1)
Sodium: 136 mmol/L (ref 135–145)
Total Bilirubin: 0.7 mg/dL (ref 0.3–1.2)
Total Protein: 6.6 g/dL (ref 6.5–8.1)

## 2021-09-26 LAB — CBC WITH DIFFERENTIAL/PLATELET
Abs Immature Granulocytes: 0.13 K/uL — ABNORMAL HIGH (ref 0.00–0.07)
Basophils Absolute: 0 K/uL (ref 0.0–0.1)
Basophils Relative: 0 %
Eosinophils Absolute: 0 K/uL (ref 0.0–0.5)
Eosinophils Relative: 0 %
HCT: 35.3 % — ABNORMAL LOW (ref 36.0–46.0)
Hemoglobin: 11.5 g/dL — ABNORMAL LOW (ref 12.0–15.0)
Immature Granulocytes: 1 %
Lymphocytes Relative: 6 %
Lymphs Abs: 1 K/uL (ref 0.7–4.0)
MCH: 26.7 pg (ref 26.0–34.0)
MCHC: 32.6 g/dL (ref 30.0–36.0)
MCV: 82.1 fL (ref 80.0–100.0)
Monocytes Absolute: 0.5 K/uL (ref 0.1–1.0)
Monocytes Relative: 3 %
Neutro Abs: 16.6 K/uL — ABNORMAL HIGH (ref 1.7–7.7)
Neutrophils Relative %: 90 %
Platelets: 323 K/uL (ref 150–400)
RBC: 4.3 MIL/uL (ref 3.87–5.11)
RDW: 13.8 % (ref 11.5–15.5)
WBC: 18.3 K/uL — ABNORMAL HIGH (ref 4.0–10.5)
nRBC: 0 % (ref 0.0–0.2)

## 2021-09-26 MED ORDER — MENTHOL 3 MG MT LOZG: 1.0000 | LOZENGE | OROMUCOSAL | Status: DC | PRN

## 2021-09-26 MED ORDER — ONDANSETRON 4 MG PO TBDP
4.0000 mg | ORAL_TABLET | Freq: Four times a day (QID) | ORAL | 0 refills | Status: DC | PRN
  Filled 2021-09-26: qty 18, 21d supply, fill #0

## 2021-09-26 MED ORDER — PANTOPRAZOLE SODIUM 40 MG PO TBEC
40.0000 mg | DELAYED_RELEASE_TABLET | Freq: Every day | ORAL | 0 refills | Status: DC
  Filled 2021-09-26: qty 90, 90d supply, fill #0

## 2021-09-26 MED ORDER — TRAMADOL HCL 50 MG PO TABS
50.0000 mg | ORAL_TABLET | Freq: Four times a day (QID) | ORAL | 0 refills | Status: DC | PRN
  Filled 2021-09-26: qty 10, 3d supply, fill #0

## 2021-09-26 NOTE — Progress Notes (Signed)
Patient alert and oriented, pain is controlled. Patient is tolerating fluids, advanced to protein shake today, patient is tolerating well. Reviewed Gastric Bypass discharge instructions with patient and patient is able to articulate understanding. Provided information on BELT program, Support Group and [facility] outpatient pharmacy. All questions answered, will continue to monitor.    

## 2021-09-26 NOTE — Discharge Summary (Signed)
Physician Discharge Summary  Tracy Morris VHQ:[id number] DOB: 06/03/1986 DOA: 09/25/2021  PCP: Glenis Smoker, MD  Admit date: 09/25/2021 Discharge date: 09/26/2021  Recommendations for Outpatient Follow-up:     Follow-up Information     [name], MD. Go on 10/19/2021.   Specialty: General Surgery Why: at 3:30pm.  Please arrive 15 minutes prior to your appointment time.  Thank you. Contact information: [address] [place] [address] [phone]         Carlena Hurl, PA-C. Go on 11/17/2021.   Specialty: General Surgery Why: at 9:45am for Dr. Redmond Pulling.  Please arrive 15 minutes prior to your appointment time.  Thank you. Contact information: [address] [place] [address] [phone]                Discharge Diagnoses:  Principal Problem:   S/P gastric bypass Severe obesity (BMI 52)  Prediabetes  Gastroesophageal reflux disease, unspecified whether esophagitis present  Surgical Procedure: Laparoscopic Roux-en-Y gastric bypass, upper endoscopy  Discharge Condition: Good Disposition: Home  Diet recommendation: Postoperative gastric bypass diet  Filed Weights   09/25/21 0926  Weight: 129 kg     Hospital Course:  The patient was admitted for a planned laparoscopic Roux-en-Y gastric bypass. Please see operative note. Preoperatively the patient was given 5000 units of subcutaneous heparin for DVT prophylaxis. ERAS protocol was used. Postoperative prophylactic Lovenox dosing was started on the evening of postoperative day 0.  The patient was started on ice chips and water on the evening of POD 0 which they tolerated. On postoperative day 1 The patient's diet was advanced to protein shakes which they also tolerated. On POD 1, The patient was ambulating without difficulty. Their vital signs are stable without fever or tachycardia. Their hemoglobin had remained stable. Even though her wbc bumped -she did receive decadron preop  for ERAS and otherwise she looked really good - no fever, no tachycardia, tolerating liquids.  The patient had received discharge instructions and counseling. They were deemed stable for discharge.  BP 135/86 (BP Location: Right Arm)    Pulse 73    Temp 98 F (36.7 C) (Oral)    Resp 19    Ht 5' 2.5" (1.588 m)    Wt 129 kg    LMP 08/30/2021    SpO2 100%    BMI 51.19 kg/m   Gen: alert, NAD, non-toxic appearing Pupils: equal, no scleral icterus Pulm: Lungs clear to auscultation, symmetric chest rise CV: regular rate and rhythm Abd: soft, min tender, nondistended. No cellulitis. No incisional hernia Ext: no edema, no calf tenderness Skin: no rash, no jaundice  Discharge Instructions  Discharge Instructions     Ambulate hourly while awake   Complete by: As directed    Call MD for:  difficulty breathing, headache or visual disturbances   Complete by: As directed    Call MD for:  persistant dizziness or light-headedness   Complete by: As directed    Call MD for:  persistant nausea and vomiting   Complete by: As directed    Call MD for:  redness, tenderness, or signs of infection (pain, swelling, redness, odor or green/yellow discharge around incision site)   Complete by: As directed    Call MD for:  severe uncontrolled pain   Complete by: As directed    Call MD for:  temperature >101 F   Complete by: As directed    Diet bariatric full liquid   Complete by: As directed  Discharge instructions   Complete by: As directed    See bariatric discharge instructions   Incentive spirometry   Complete by: As directed    Perform hourly while awake      Allergies as of 09/26/2021   No Known Allergies      Medication List     STOP taking these medications    cyclobenzaprine 10 MG tablet Commonly known as: FLEXERIL   naproxen 500 MG tablet Commonly known as: NAPROSYN   Ozempic (1 MG/DOSE) 4 MG/3ML Sopn Generic drug: Semaglutide (1 MG/DOSE)       TAKE these medications     acetaminophen 500 MG tablet Commonly known as: TYLENOL Take 1,000 mg by mouth every 6 (six) hours as needed for moderate pain.   Biotin 5000 MCG Tabs Take 5,000 mcg by mouth daily.   Fluocinolone Acetonide Scalp 0.01 % Oil Apply 1 application topically daily as needed (itchy/flaky scalp).   Jencycla 0.35 MG tablet Generic drug: norethindrone Take 1 tablet by mouth daily.   ketoconazole 2 % shampoo Commonly known as: NIZORAL Apply 1 application topically every 14 (fourteen) days.   ketotifen 0.025 % ophthalmic solution Commonly known as: ZADITOR Place 1 drop into both eyes daily as needed (allergies).   loratadine 10 MG tablet Commonly known as: CLARITIN Take 10 mg by mouth daily as needed for allergies.   ondansetron 4 MG disintegrating tablet Commonly known as: ZOFRAN-ODT Take 1 tablet (4 mg total) by mouth every 6 (six) hours as needed for nausea or vomiting.   pantoprazole 40 MG tablet Commonly known as: PROTONIX Take 1 tablet (40 mg total) by mouth daily.   traMADol 50 MG tablet Commonly known as: ULTRAM Take 1 tablet (50 mg total) by mouth every 6 (six) hours as needed (pain).        Follow-up Information     [name], MD. Go on 10/19/2021.   Specialty: General Surgery Why: at 3:30pm.  Please arrive 15 minutes prior to your appointment time.  Thank you. Contact information: [address] [place] [address] [phone]         Carlena Hurl, PA-C. Go on 11/17/2021.   Specialty: General Surgery Why: at 9:45am for Dr. Redmond Pulling.  Please arrive 15 minutes prior to your appointment time.  Thank you. Contact information: [address] [place] [address] [phone]                  The results of significant diagnostics from this hospitalization (including imaging, microbiology, ancillary and laboratory) are listed below for reference.    Significant Diagnostic Studies: No results found.  Labs: Basic Metabolic  Panel: Recent Labs  Lab 09/21/21 1435 09/26/21 0424  NA 138 136  K 4.0 4.9  CL 107 102  CO2 25 26  GLUCOSE 88 129*  BUN 11 10  CREATININE 0.92 1.00  CALCIUM 8.8* 8.8*   Liver Function Tests: Recent Labs  Lab 09/21/21 1435 09/26/21 0424  AST 23 14*  ALT 108* 38  ALKPHOS 79 58  BILITOT 0.3 0.7  PROT 7.0 6.6  ALBUMIN 3.7 3.4*    CBC: Recent Labs  Lab 09/21/21 1435 09/25/21 1609 09/26/21 0424  WBC 6.1  --  18.3*  NEUTROABS 3.4  --  16.6*  HGB 11.7* 12.0 11.5*  HCT 36.1 38.8 35.3*  MCV 82.6  --  82.1  PLT 295  --  323    CBG: Recent Labs  Lab 09/21/21 [place]  100*    Principal Problem:   S/P gastric bypass   Time coordinating discharge: 15 min  Signed:  Gayland Curry, MD [facility], [place] [phone] 09/26/2021, 3:55 PM

## 2021-09-26 NOTE — Progress Notes (Signed)
Transition of Care (TOC) Screening Note  Patient Details  Name: Tracy Morris Date of Birth: 1986-01-30  Transition of Care [facility]) CM/SW Contact:    Sherie Don, LCSW Phone Number: 09/26/2021, 10:58 AM  Transition of Care Department [facility]) has reviewed patient and no TOC needs have been identified at this time. We will continue to monitor patient advancement through interdisciplinary progression rounds. If new patient transition needs arise, please place a TOC consult.

## 2021-09-26 NOTE — Discharge Instructions (Addendum)
RESUME ORAL BIRTH CONTROL PILLS IN 4 WEEKS   GASTRIC BYPASS / [place] Instructions  These instructions are to help you care for yourself when you go home.  Call: If you have any problems. Call [phone] and ask for the surgeon on call If you have an emergency related to your surgery please use the ER at [facility].  Tell the ER staff that you are a new post-op gastric bypass or gastric sleeve patient   Signs and symptoms to report: Severe vomiting or nausea If you cannot handle clear liquids for longer than 1 day, call your surgeon  Abdominal pain which does not get better after taking your pain medication Fever greater than 100.4 F and chills Heart rate over 100 beats a minute Trouble breathing Chest pain  Redness, swelling, drainage, or foul odor at incision (surgical) sites  If your incisions open or pull apart Swelling or pain in calf (lower leg) Diarrhea (Loose bowel movements that happen often), frequent watery, uncontrolled bowel movements Constipation, (no bowel movements for 3 days) if this happens:  Take Milk of Magnesia, 2 tablespoons by mouth, 3 times a day for 2 days if needed Stop taking Milk of Magnesia once you have had a bowel movement Call your doctor if constipation continues Or Take Miralax  (instead of Milk of Magnesia) following the label instructions Stop taking Miralax once you have had a bowel movement Call your doctor if constipation continues Anything you think is "abnormal for you"   Normal side effects after surgery: Unable to sleep at night or unable to concentrate Irritability Being tearful (crying) or depressed These are common complaints, possibly related to your anesthesia, stress of surgery and change in lifestyle, that usually go away a few weeks after surgery.  If these feelings continue, call your medical doctor.  Wound Care: You may have surgical glue, steri-strips, or staples over your incisions after surgery Surgical glue:   Looks like a clear film over your incisions and will wear off a little at a time Steri-strips : Adhesive strips of tape over your incisions. You may notice a yellowish color on the skin under the steri-strips. This is used to make the   steri-strips stick better. Do not pull the steri-strips off - let them fall off Staples: Staples may be removed before you leave the hospital If you go home with staples, call [facility] at for an appointment with your surgeon's nurse to have staples removed 10 days after surgery, [phone] Showering: You may shower two (2) days after your surgery unless your surgeon tells you differently Wash gently around incisions with warm soapy water, rinse well, and gently pat dry  If you have a drain (tube from your incision), you may need someone to hold this while you shower  No tub baths until staples are removed and incisions are healed     Medications: Medications should be liquid or crushed if larger than the size of a dime Extended release pills (medication that releases a little bit at a time through the day) should not be crushed Depending on the size and number of medications you take, you may need to space (take a few throughout the day)/change the time you take your medications so that you do not over-fill your pouch (smaller stomach) Make sure you follow-up with your primary care physician to make medication changes needed during rapid weight loss and life-style changes If you have diabetes, follow up with the doctor that orders  your diabetes medication(s) within one week after surgery and check your blood sugar regularly. Do not drive while taking narcotics (pain medications) DO NOT take NSAID'S (Examples of NSAID's include ibuprofen, naproxen)  Diet:                    First 2 Weeks  You will see the nutritionist about two (2) weeks after your surgery. The nutritionist will increase the types of foods you can eat if you are handling liquids  well: If you have severe vomiting or nausea and cannot handle clear liquids lasting longer than 1 day, call your surgeon  Protein Shake Drink at least 2 ounces of shake 5-6 times per day Each serving of protein shakes (usually 8 - 12 ounces) should have a minimum of:  15 grams of protein  And no more than 5 grams of carbohydrate  Goal for protein each day: Men = 80 grams per day Women = 60 grams per day Protein powder may be added to fluids such as non-fat milk or Lactaid milk or Soy milk (limit to 35 grams added protein powder per serving)  Hydration Slowly increase the amount of water and other clear liquids as tolerated (See Acceptable Fluids) Slowly increase the amount of protein shake as tolerated   Sip fluids slowly and throughout the day May use sugar substitutes in small amounts (no more than 6 - 8 packets per day; i.e. Splenda)  Fluid Goal The first goal is to drink at least 8 ounces of protein shake/drink per day (or as directed by the nutritionist);  See handout from pre-op Bariatric Education Class for examples of protein shake/drink.   Slowly increase the amount of protein shake you drink as tolerated You may find it easier to slowly sip shakes throughout the day It is important to get your proteins in first Your fluid goal is to drink 64 - 100 ounces of fluid daily It may take a few weeks to build up to this 32 oz (or more) should be clear liquids  And  32 oz (or more) should be full liquids (see below for examples) Liquids should not contain sugar, caffeine, or carbonation  Clear Liquids: Water or Sugar-free flavored water (i.e. Fruit H2O, Propel) Decaffeinated coffee or tea (sugar-free) Crystal Lite, Wyler's Lite, Minute Maid Lite Sugar-free Jell-O Bouillon or broth Sugar-free Popsicle:   *Less than 20 calories each; Limit 1 per day  Full Liquids: Protein Shakes/Drinks + 2 choices per day of other full liquids Full liquids must be: No More Than 12 grams of  Carbs per serving  No More Than 3 grams of Fat per serving Strained low-fat cream soup Non-Fat milk Fat-free Lactaid Milk Sugar-free yogurt (Dannon Lite & Fit, Greek yogurt)      Vitamins and Minerals Start 1 day after surgery unless otherwise directed by your surgeon Bariatric Specific Complete Multivitamins Chewable Calcium Citrate with Vitamin D-3 (Example: 3 Chewable Calcium Plus 600 with Vitamin D-3) Take 500 mg three (3) times a day for a total of 1500 mg each day Do not take all 3 doses of calcium at one time as it may cause constipation, and you can only absorb 500 mg  at a time  Do not mix multivitamins containing iron with calcium supplements; take 2 hours apart  Menstruating women and those at risk for anemia (a blood disease that causes weakness) may need extra iron Talk with your doctor to see if you need more iron If you need extra iron:  Total daily Iron recommendation (including Vitamins) is 50 to 100 mg Iron/day Do not stop taking or change any vitamins or minerals until you talk to your nutritionist or surgeon Your nutritionist and/or surgeon must approve all vitamin and mineral supplements   Activity and Exercise: It is important to continue walking at home.  Limit your physical activity as instructed by your doctor.  During this time, use these guidelines: Do not lift anything greater than ten (10) pounds for at least two (2) weeks Do not go back to work or drive until Engineer, production says you can You may have sex when you feel comfortable  It is VERY important for female patients to use a reliable birth control method; fertility often increases after surgery  Do not get pregnant for at least 18 months Start exercising as soon as your doctor tells you that you can Make sure your doctor approves any physical activity Start with a simple walking program Walk 5-15 minutes each day, 7 days per week.  Slowly increase until you are walking 30-45 minutes per day Consider  joining our [place] program. [phone] or email [email or web address]   Special Instructions Things to remember:  Use your CPAP when sleeping if this applies to you, do not stop the use of CPAP unless directed by physician after a sleep study [facility] has a free Bariatric Surgery Support Group that meets monthly, the 3rd Thursday, 6 pm.  Please review discharge information for date and location of this meeting. It is very important to keep all follow up appointments with your surgeon, nutritionist, primary care physician, and behavioral health practitioner After the first year, please follow up with your bariatric surgeon and nutritionist at least once a year in order to maintain best weight loss results   [facility]: [place]: [phone] Bariatric Nurse Coordinator: [phone]

## 2021-09-26 NOTE — Progress Notes (Signed)
Patient was given discharge instructions, and all questions were answered.  Patient was stable for discharge and was taken to the main exit by wheelchair. 

## 2021-09-27 ENCOUNTER — Telehealth (HOSPITAL_COMMUNITY): Payer: Self-pay | Admitting: *Deleted

## 2021-09-27 NOTE — Progress Notes (Signed)
24hr fluid recall prior discharge: 659mL.  Per dehydration protocol, will call pt to f/u within one week post op.

## 2021-10-02 NOTE — Telephone Encounter (Signed)
1.  Tell me about your pain and pain management? Pt denies any pain.  2.  Let's talk about fluid intake.  How much total fluid are you taking in? Pt states that she is working to meet goal of 64 oz of fluid today.  Pt has been able to consume approx. 55-56oz of fluid per day since surgery.  Pt plans to increase clear liquids to meet fluid goals.  Pt instructed to assess status and suggestions daily utilizing Hydration Action Plan on discharge folder and to call [facility] if in the "red zone".   3.  How much protein have you taken in the last 2 days? Pt states that she is working to meet goal of goal of 60g of protein today.  Pt has been able to consume approx. 45g of protein daily.  Discussed with pt options to meeting protein goals including adding non-flavored powder protein to liquids and protein yogurts.   4.  Have you had nausea?  Tell me about when have experienced nausea and what you did to help? Pt denies nausea.   5.  Has the frequency or color changed with your urine? Pt states that she is urinating "just fine" with no changes in frequency or urgency.     6.  Tell me what your incisions look like? "Incisions look fine" with the exception of 1 "right under my breast".  Pt states it "feels hot and hard around it".  Pt denies a fever, chills.  Pt states that the other incisions are "fine" and are not swollen, open, or draining.  Pt states that she has an appt at [place] today at 2pm for f/u.    7.  Have you been passing gas? BM? Pt states that she is having BMs. Last BM 09/29/21.   Pt instructed to take either Miralax or MoM as instructed per "Gastric Bypass/Sleeve Discharge Home Care Instructions" fo rany further issues. Pt to call surgeon's office if not able to have BM with medication.   8.  If a problem or question were to arise who would you call?  Do you know contact numbers for [place], [facility], and [facility]? Pt denies dehydration symptoms.  Pt can describe s/sx of dehydration.  Pt knows to call [facility]  for surgical, [facility] for nutrition, and [place] [facility] for non-urgent questions or concerns.   9.  How has the walking going? Pt states she is walking around and able to be active without difficulty.   10. Are you still using your incentive spirometer?  If so, how often? Pt states that she is doing I.S. BID. Pt encouraged to use incentive spirometer, at least 10x every hour while awake until she sees the surgeon.  11.  How are your vitamins and calcium going?  How are you taking them? Pt states that she is taking her supplements and vitamins without difficulty.  Reminded patient that the first 30 days post-operatively are important for successful recovery.  Practice good hand hygiene, wearing a mask when appropriate (since optional in most places), and minimizing exposure to people who live outside of the home, especially if they are exhibiting any respiratory, GI, or illness-like symptoms.

## 2021-10-10 ENCOUNTER — Other Ambulatory Visit: Payer: Self-pay

## 2021-10-10 ENCOUNTER — Encounter: Payer: BC Managed Care – PPO | Attending: General Surgery | Admitting: Skilled Nursing Facility1

## 2021-10-10 DIAGNOSIS — E669 Obesity, unspecified: Secondary | ICD-10-CM | POA: Diagnosis present

## 2021-10-11 NOTE — Progress Notes (Signed)
2 Week Post-Operative Nutrition Class   Patient was seen on 10/10/2021 for Post-Operative Nutrition education at the [facility].    Surgery date: 09/25/2021 Surgery type: RYGB Start weight at [facility]: 294.1 lbs Lamar Benes today: pt declined  Bowel Habits: pt states she has had a problem with constipation. dietitian advised she call her surgeon because she has only had 3 bowel movements since surgery and miralax and enema did not help.     The following the learning objectives were met by the patient during this course: Identifies Phase 3 (Soft, High Proteins) Dietary Goals and will begin from 2 weeks post-operatively to 2 months post-operatively Identifies appropriate sources of fluids and proteins  Identifies appropriate fat sources and healthy verses unhealthy fat types   States protein recommendations and appropriate sources post-operatively Identifies the need for appropriate texture modifications, mastication, and bite sizes when consuming solids Identifies appropriate fat consumption and sources Identifies appropriate multivitamin and calcium sources post-operatively Describes the need for physical activity post-operatively and will follow MD recommendations States when to call healthcare provider regarding medication questions or post-operative complications   Handouts given during class include: Phase 3A: Soft, High Protein Diet Handout Phase 3 High Protein Meals Healthy Fats   Follow-Up Plan: Patient will follow-up at [facility] in 6 weeks for 2 month post-op nutrition visit for diet advancement per MD.

## 2021-10-12 ENCOUNTER — Telehealth: Payer: Self-pay | Admitting: Skilled Nursing Facility1

## 2021-10-12 NOTE — Telephone Encounter (Signed)
Called to check in on pts constipation.  Pt states she did take 2 linzess she had which caused a small lose stool and has not tried any solid proteins yet for fear.  Dietitian advised pt to try at least some beans and continue with her bowel medication but if she does not have a formed solid stool by Monday to call her surgeon.

## 2021-10-16 ENCOUNTER — Telehealth: Payer: Self-pay | Admitting: Skilled Nursing Facility1

## 2021-10-16 NOTE — Telephone Encounter (Signed)
RD called pt to verify fluid intake once starting soft, solid proteins 2 week post-bariatric surgery.   Daily Fluid intake: 64 oz Daily Protein intake: 60 g Bowel Habits: every day to every other day  Concerns/issues:   None stated 

## 2021-11-18 ENCOUNTER — Emergency Department (HOSPITAL_COMMUNITY): Payer: BC Managed Care – PPO

## 2021-11-18 ENCOUNTER — Encounter (HOSPITAL_COMMUNITY): Payer: Self-pay

## 2021-11-18 ENCOUNTER — Other Ambulatory Visit: Payer: Self-pay

## 2021-11-18 ENCOUNTER — Emergency Department (HOSPITAL_COMMUNITY)
Admission: EM | Admit: 2021-11-18 | Discharge: 2021-11-18 | Disposition: A | Discharge status: Home / Self Care | Payer: BC Managed Care – PPO | Attending: Emergency Medicine | Admitting: Emergency Medicine

## 2021-11-18 DIAGNOSIS — N9489 Other specified conditions associated with female genital organs and menstrual cycle: Secondary | ICD-10-CM | POA: Insufficient documentation

## 2021-11-18 DIAGNOSIS — K219 Gastro-esophageal reflux disease without esophagitis: Secondary | ICD-10-CM | POA: Insufficient documentation

## 2021-11-18 DIAGNOSIS — R109 Unspecified abdominal pain: Secondary | ICD-10-CM | POA: Diagnosis present

## 2021-11-18 DIAGNOSIS — R1013 Epigastric pain: Secondary | ICD-10-CM | POA: Insufficient documentation

## 2021-11-18 DIAGNOSIS — K5900 Constipation, unspecified: Secondary | ICD-10-CM | POA: Diagnosis not present

## 2021-11-18 DIAGNOSIS — D259 Leiomyoma of uterus, unspecified: Secondary | ICD-10-CM

## 2021-11-18 DIAGNOSIS — R112 Nausea with vomiting, unspecified: Secondary | ICD-10-CM | POA: Insufficient documentation

## 2021-11-18 LAB — CBC WITH DIFFERENTIAL/PLATELET
Abs Immature Granulocytes: 0.02 10*3/uL (ref 0.00–0.07)
Basophils Absolute: 0 10*3/uL (ref 0.0–0.1)
Basophils Relative: 0 %
Eosinophils Absolute: 0.1 10*3/uL (ref 0.0–0.5)
Eosinophils Relative: 2 %
HCT: 38 % (ref 36.0–46.0)
Hemoglobin: 12.1 g/dL (ref 12.0–15.0)
Immature Granulocytes: 0 %
Lymphocytes Relative: 33 %
Lymphs Abs: 2 10*3/uL (ref 0.7–4.0)
MCH: 26.5 pg (ref 26.0–34.0)
MCHC: 31.8 g/dL (ref 30.0–36.0)
MCV: 83.2 fL (ref 80.0–100.0)
Monocytes Absolute: 0.4 10*3/uL (ref 0.1–1.0)
Monocytes Relative: 7 %
Neutro Abs: 3.4 10*3/uL (ref 1.7–7.7)
Neutrophils Relative %: 58 %
Platelets: 245 10*3/uL (ref 150–400)
RBC: 4.57 MIL/uL (ref 3.87–5.11)
RDW: 15.6 % — ABNORMAL HIGH (ref 11.5–15.5)
WBC: 5.9 10*3/uL (ref 4.0–10.5)
nRBC: 0 % (ref 0.0–0.2)

## 2021-11-18 LAB — COMPREHENSIVE METABOLIC PANEL
ALT: 13 U/L (ref 0–44)
AST: 10 U/L — ABNORMAL LOW (ref 15–41)
Albumin: 3.6 g/dL (ref 3.5–5.0)
Alkaline Phosphatase: 63 U/L (ref 38–126)
Anion gap: 7 (ref 5–15)
BUN: 11 mg/dL (ref 6–20)
CO2: 25 mmol/L (ref 22–32)
Calcium: 8.8 mg/dL — ABNORMAL LOW (ref 8.9–10.3)
Chloride: 104 mmol/L (ref 98–111)
Creatinine, Ser: 0.84 mg/dL (ref 0.44–1.00)
GFR, Estimated: >60 mL/min (ref >60)
Glucose, Bld: 80 mg/dL (ref 70–99)
Potassium: 3.9 mmol/L (ref 3.5–5.1)
Sodium: 136 mmol/L (ref 135–145)
Total Bilirubin: 0.4 mg/dL (ref 0.3–1.2)
Total Protein: 6.8 g/dL (ref 6.5–8.1)

## 2021-11-18 LAB — LACTIC ACID, PLASMA: Lactic Acid, Venous: 1.9 mmol/L (ref 0.5–1.9)

## 2021-11-18 LAB — I-STAT BETA HCG BLOOD, ED (MC, WL, AP ONLY): I-stat hCG, quantitative: <5 m[IU]/mL (ref <5)

## 2021-11-18 LAB — LIPASE, BLOOD: Lipase: 63 U/L — ABNORMAL HIGH (ref 11–51)

## 2021-11-18 MED ORDER — IOHEXOL 300 MG/ML  SOLN
100.0000 mL | Freq: Once | INTRAMUSCULAR | Status: AC | PRN
  Administered 2021-11-18: 100 mL via INTRAVENOUS

## 2021-11-18 MED ORDER — MORPHINE SULFATE (PF) 4 MG/ML IV SOLN
4.0000 mg | Freq: Once | INTRAVENOUS | Status: AC
  Administered 2021-11-18: 4 mg via INTRAVENOUS
  Filled 2021-11-18: qty 1

## 2021-11-18 MED ORDER — SODIUM CHLORIDE 0.9 % IV BOLUS
1000.0000 mL | Freq: Once | INTRAVENOUS | Status: AC
  Administered 2021-11-18: 1000 mL via INTRAVENOUS

## 2021-11-18 MED ORDER — ONDANSETRON HCL 4 MG/2ML IJ SOLN
4.0000 mg | Freq: Once | INTRAMUSCULAR | Status: AC
  Administered 2021-11-18: 4 mg via INTRAVENOUS
  Filled 2021-11-18: qty 2

## 2021-11-18 NOTE — Discharge Instructions (Signed)
Lab work and CT scan overall looks good.  From CT today I do not see signs of any complications from your bypass surgery, continue to follow-up with your surgeon as planned.  You do have multiple uterine fibroids noted which could be contributing to some discomfort or sensation of constipation but you do not have a significant amount of stool retained on your CT that suggest constipation so I do not need to continue the Linzess at this time if it is worsening your pain.  Please follow-up with your GYN regarding uterine fibroids.  Return for new or worsening symptoms.

## 2021-11-18 NOTE — ED Provider Notes (Signed)
[facility] DEPT Provider Note   CSN: [id number] Arrival date & time: 11/18/21  0518     History  Chief Complaint  Patient presents with   Post-op Problem    Tracy Morris is a 36 y.o. female.  HPI   Pt is a 36 y/o female with a h/o gerd, prediabetes, gastric roux en y bypass who presents to the ED today for evaluation of abd pain. Pain located to the epigastrium. She was seen earlier today by her general surgeon. She was given linzess due to reports of constipation. She took the medication after her visit and states that her pain has significantly worsened. States she has not been able to eat due to the pain. She has had some nv. Denies fevers. Last BM was 5 days ago.  Home Medications Prior to Admission medications   Medication Sig Start Date End Date Taking? Authorizing Provider  acetaminophen (TYLENOL) 500 MG tablet Take 1,000 mg by mouth every 6 (six) hours as needed for moderate pain.    [provider]  Biotin 5000 MCG TABS Take 5,000 mcg by mouth daily.    [provider]  Fluocinolone Acetonide Scalp 0.01 % OIL Apply 1 application topically daily as needed (itchy/flaky scalp). 09/13/21   [provider]  JENCYCLA 0.35 MG tablet Take 1 tablet by mouth daily. 08/18/21   [provider]  ketoconazole (NIZORAL) 2 % shampoo Apply 1 application topically every 14 (fourteen) days. 08/21/21   [provider]  ketotifen (ZADITOR) 0.025 % ophthalmic solution Place 1 drop into both eyes daily as needed (allergies).    [provider]  loratadine (CLARITIN) 10 MG tablet Take 10 mg by mouth daily as needed for allergies.    [provider]  ondansetron (ZOFRAN-ODT) 4 MG disintegrating tablet Dissolve 1 tablet by mouth every 6 hours as needed for nausea or vomiting. 09/26/21   [name], MD  pantoprazole (PROTONIX) 40 MG tablet Take 1 tablet by mouth daily. 09/26/21   [name], MD  traMADol  (ULTRAM) 50 MG tablet Take 1 tablet by mouth every 6 hours as needed (pain). 09/26/21   [name], MD      Allergies    Patient has no known allergies.    Review of Systems   Review of Systems See HPI for pertinent positives or negatives.   Physical Exam Updated Vital Signs BP 115/67 (BP Location: Right Arm)    Pulse 75    Temp 98.2 F (36.8 C) (Oral)    Resp 14    SpO2 100%  Physical Exam Vitals and nursing note reviewed.  Constitutional:      General: She is not in acute distress.    Appearance: She is well-developed.  HENT:     Head: Normocephalic and atraumatic.  Eyes:     Conjunctiva/sclera: Conjunctivae normal.  Cardiovascular:     Rate and Rhythm: Normal rate and regular rhythm.     Heart sounds: Normal heart sounds. No murmur heard. Pulmonary:     Effort: Pulmonary effort is normal. No respiratory distress.     Breath sounds: Normal breath sounds.  Abdominal:     General: Bowel sounds are decreased.     Palpations: Abdomen is soft.     Tenderness: There is abdominal tenderness in the right upper quadrant, epigastric area, left upper quadrant and left lower quadrant.  Musculoskeletal:        General: No swelling.     Cervical back: Neck supple.  Skin:    General: Skin is warm and dry.     Capillary Refill: Capillary refill takes less than 2 seconds.  Neurological:     Mental Status: She is alert.  Psychiatric:        Mood and Affect: Mood normal.     ED Results / Procedures / Treatments   Labs (all labs ordered are listed, but only abnormal results are displayed) Labs Reviewed  CBC WITH DIFFERENTIAL/PLATELET  COMPREHENSIVE METABOLIC PANEL  LIPASE, BLOOD  URINALYSIS, ROUTINE W REFLEX MICROSCOPIC  LACTIC ACID, PLASMA  LACTIC ACID, PLASMA  I-STAT BETA HCG BLOOD, ED ([facility], [facility], [facility] ONLY)    EKG None  Radiology No results found.  Procedures Procedures    Medications Ordered in ED Medications  sodium chloride 0.9 % bolus 1,000 mL (has no  administration in time range)  morphine (PF) 4 MG/ML injection 4 mg (has no administration in time range)  ondansetron (ZOFRAN) injection 4 mg (has no administration in time range)    ED Course/ Medical Decision Making/ A&P                           Medical Decision Making Amount and/or Complexity of Data Reviewed Labs: ordered. Radiology: ordered.  Risk Prescription drug management.   This patient presents to the ED for concern of abd pain, this involves an extensive number of treatment options, and is a complaint that carries with it a high risk of complications and morbidity.  The differential diagnosis includes intraabdominal infection, bowel obstruction, post op hernia, mesenteric ischemia.   Comorbidities that complicate the patient evaluation: Patients presentation is complicated by their history of gastric bypass surgery  Additional history obtained: Records reviewed Care Everywhere/External Records - Surgery note from earlier today details plan to start pt on linzess   At shift change, care transitioned to Benedetto Goad, PA-C pending labs and imaging.    Final Clinical Impression(s) / ED Diagnoses Final diagnoses:  Abdominal pain, unspecified abdominal location    Rx / DC Orders ED Discharge Orders     None         Rodney Booze, PA-C 11/18/21 0640    Ripley Fraise, MD 11/18/21 [phone]

## 2021-11-18 NOTE — ED Provider Notes (Signed)
Care assumed from [place] at shift change, please see her note for full details, but in brief Tracy Morris is a 36 y.o. 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  Pain primarily in the epigastric region, followed with her surgeon earlier today and was given Linzess due to concern for constipation, took the medication and reports since then she has had worsened pain.  Has had some nausea and vomiting but no fevers.  Last BM was 5 days ago.  Has had significant changes to her diet since surgery.  Labs and CT abdomen pelvis pending at time of shift change.Marland Kitchen  Physical Exam  BP 105/61    Pulse 66    Temp 98.2 F (36.8 C) (Oral)    Resp 18    SpO2 100%   Physical Exam Vitals and nursing note reviewed.  Constitutional:      General: She is not in acute distress.    Appearance: Normal appearance. She is well-developed. She is not ill-appearing or diaphoretic.  HENT:     Head: Normocephalic and atraumatic.  Eyes:     General:        Right eye: No discharge.        Left eye: No discharge.  Pulmonary:     Effort: Pulmonary effort is normal. No respiratory distress.  Abdominal:     Comments: Abdomen is soft and nondistended, mild generalized tenderness  Neurological:     Mental Status: She is alert and oriented to person, place, and time.     Coordination: Coordination normal.  Psychiatric:        Mood and Affect: Mood normal.        Behavior: Behavior normal.    ED Course / MDM   Labs Reviewed  CBC WITH DIFFERENTIAL/PLATELET - Abnormal; Notable for the following components:      Result Value   RDW 15.6 (*)    All other components within normal limits  COMPREHENSIVE METABOLIC PANEL - Abnormal; Notable for the following components:   Calcium 8.8 (*)    AST 10 (*)    All other components within normal limits  LIPASE, BLOOD - Abnormal; Notable for the following components:   Lipase 63 (*)    All  other components within normal limits  LACTIC ACID, PLASMA  URINALYSIS, ROUTINE W REFLEX MICROSCOPIC  I-STAT BETA HCG BLOOD, ED ([facility], [facility], [facility] ONLY)   CT Abdomen Pelvis W Contrast  Result Date: 11/18/2021 CLINICAL DATA:  Abdominal pain.  History of gastric bypass. EXAM: CT ABDOMEN AND PELVIS WITH CONTRAST TECHNIQUE: Multidetector CT imaging of the abdomen and pelvis was performed using the standard protocol following bolus administration of intravenous contrast. RADIATION DOSE REDUCTION: This exam was performed according to the departmental dose-optimization program which includes automated exposure control, adjustment of the mA and/or kV according to patient size and/or use of iterative reconstruction technique. CONTRAST:  134mL OMNIPAQUE IOHEXOL 300 MG/ML  SOLN COMPARISON:  None. FINDINGS: Lower chest: Unremarkable. Hepatobiliary: Tiny hypodensity in the dome of the liver is too small to characterize but likely benign. Small area of low attenuation in the anterior liver, adjacent to the falciform ligament, is in a characteristic location for focal fatty deposition. Gallbladder is distended. No intrahepatic or extrahepatic biliary dilation. Pancreas: No focal mass lesion. No dilatation of the main duct. No intraparenchymal cyst. No peripancreatic edema. Spleen: No splenomegaly. No focal mass lesion. Adrenals/Urinary Tract: No adrenal nodule or mass.  Kidneys unremarkable. No evidence for hydroureter. The urinary bladder appears normal for the degree of distention. Stomach/Bowel: Surgical changes in the stomach compatible with the history of gastric bypass. Duodenum is normally positioned as is the ligament of Treitz. Roux limb is nondilated. No small bowel wall thickening. No small bowel dilatation. The terminal ileum is normal. The appendix is normal. No gross colonic mass. No colonic wall thickening. Vascular/Lymphatic: No abdominal aortic aneurysm. No abdominal aortic atherosclerotic calcification. There is no  gastrohepatic or hepatoduodenal ligament lymphadenopathy. No retroperitoneal or mesenteric lymphadenopathy. No pelvic sidewall lymphadenopathy. Reproductive: Lobular uterine contour compatible with fibroids. There is no adnexal mass. Other: Small volume free fluid noted in the cul-de-sac. Musculoskeletal: No worrisome lytic or sclerotic osseous abnormality. IMPRESSION: 1. No acute findings in the abdomen or pelvis. 2. Surgical changes in the stomach compatible with the history of gastric bypass. No dilatation of the Roux or biliary limbs. 3. Small volume free fluid in the cul-de-sac, nonspecific. This can be physiologic in a premenopausal female. 4. Uterine fibroids. Electronically Signed   By: Misty Stanley M.D.   On: 11/18/2021 08:42      36 y.o. female presents to the ED with complaints of abdominal pain, this involves an extensive number of treatment options, and is a complaint that carries with it a high risk of complications and morbidity.  The differential diagnosis includes intraabdominal infection, bowel obstruction, post op hernia, mesenteric ischemia, constipation, gastritis, PUD   Patient received IV fluids, morphine and Zofran for symptomatic management with improvement in her symptoms. Was started on Linzess earlier today but this significantly worsened her abdominal pain and she has not had a bowel movement.  Lab Tests:  I Ordered, reviewed, and interpreted labs, which included: No leukocytosis and no significant electrolyte derangements, normal renal and liver function.  Minimal elevation in lipase, normal lactic acid and negative pregnancy test  Imaging Studies ordered:  I ordered imaging studies which included CT abdomen pelvis, I independently visualized and interpreted imaging which showed no acute findings in the abdomen or pelvis, surgical changes compatible with gastric bypass with no dilation of the Roux or biliary limbs, patient also with multiple uterine fibroids.  Agree with  radiologist interpretation.  ED Course:   Patient symptoms have improved with treatment here in the ED, she is tolerating p.o. food and fluids.  CT does not show significant stool burden or constipation, no suggestion of complication from Roux-en-Y gastric bypass, she does have multiple uterine fibroids which patient did not have prior knowledge of.  This could be contributing to sensation of constipation potentially.  We will have patient continue follow-up with her surgeon as planned and have her follow-up with her gynecologist regarding uterine fibroids.  Since Linzess seem to worsen her pain and she does not have significant constipation on CT we will have her discontinue this until she discusses further with her surgeon.  Patient expresses understanding and agreement.  At this time feel she is stable for discharge home.   Portions of this note were generated with [employer]. Dictation errors may occur despite best attempts at proofreading.        Jacqlyn Larsen, PA-C 11/18/21 1442    Valarie Merino, MD 11/20/21 [phone]

## 2021-11-18 NOTE — ED Triage Notes (Signed)
Pt complains of abdominal pain that is worsening. Pt states that she had a gastric bypass 09/25/21. Pt also reports constipation, stating that she took a Linzess and the pain continued.

## 2021-11-18 NOTE — ED Notes (Signed)
Pt given water and saltines and able eat and drink without difficulty.

## 2021-11-21 ENCOUNTER — Other Ambulatory Visit: Payer: Self-pay

## 2021-11-21 ENCOUNTER — Encounter: Payer: BC Managed Care – PPO | Attending: General Surgery | Admitting: Skilled Nursing Facility1

## 2021-11-21 DIAGNOSIS — E669 Obesity, unspecified: Secondary | ICD-10-CM | POA: Insufficient documentation

## 2021-11-21 NOTE — Progress Notes (Signed)
Bariatric Nutrition Follow-Up Visit Medical Nutrition Therapy    NUTRITION ASSESSMENT    Anthropometrics  Surgery date: 09/25/2021 Surgery type: RYGB Start weight at [facility]: 294.1 lbs Lamar Benes today: pt declined  Body Composition Scale 07/17/2021  Current Body Weight 291.7  Total Body Fat % 48.9  Visceral Fat 18  Fat-Free Mass % 51   Total Body Water % 40  Muscle-Mass lbs 31.9  BMI 52.6  Body Fat Displacement          Torso  lbs 88.6         Left Leg  lbs 17.7         Right Leg  lbs 17.7         Left Arm  lbs 8.8         Right Arm   lbs 8.8   Clinical  Medical hx: DM, GERD Medications: Ozempic  Labs: A1C 5.7 Notable signs/symptoms: reflux, constipation  Any previous deficiencies? No Lifestyle & Dietary Hx  Pt states she is going to get an upper GI due to her having issues with digestion stating it feels it is just sitting there pt states she is taking small bites, eating slowly, chewing well, cutting her foods into small bites so she put her self back on liquids. Pt states she has tried fish which came back up, baked chicken came back up, beef came back up.    Pt states beans, cheese, soups do fine.   Pt states she Has not had any bowel movement in 3 weeks  Pt states she has not been meeting her protein goal due to not liking the taste of the protein shakes.   Pt states her fibroids are pressing on her colon causing the constipation.   Pt states she has not pooped in 3 weeks.   Pt states she is having no problems with any liquids.   Pt state she was prescribed linzess but her OBGYN advised she not take it due to the fibroids. Dietitian advised pt to notify [place] she was advised not to take the linzess and she has not had a bowel movement in 3 weeks.  Pt states he is going to try another enema: Dietitian advised she discuss this wither her doctor as with her current situation it may not be safe.   Pt can tolerate a number of moist/soft proteins but has not had a bowel  movement in 3 weeks.   Estimated daily fluid intake: 40 oz Estimated daily protein intake: 64 g Supplements: multi and calcium  Current average weekly physical activity: ADL's   24-Hr Dietary Recall First Meal: protein shake Snack:   Second Meal: half protein shake Snack:   Third Meal:  Snack:  Beverages: Gatorade zero, water, water + flavorings   Post-Op Goals/ Signs/ Symptoms Using straws: no Drinking while eating: no Chewing/swallowing difficulties: no Changes in vision: no Changes to mood/headaches: no Hair loss/changes to skin/nails: no Difficulty focusing/concentrating: no Sweating: no Limb weakness: no Dizziness/lightheadedness: no Palpitations: no  Carbonated/caffeinated beverages: no N/V/D/C/Gas: constipation  Abdominal pain: no Dumping syndrome: no    NUTRITION DIAGNOSIS  Overweight/obesity ([place]-3.3) related to past poor dietary habits and physical inactivity as evidenced by completed bariatric surgery and following dietary guidelines for continued weight loss and healthy nutrition status.     NUTRITION INTERVENTION Nutrition counseling (C-1) and education (E-2) to facilitate bariatric surgery goals, including: Chronic constipation and other GI disturbances   Goals: only liquids  -meet your 60 grams protein per day -keep the  protein shakes in the morning  -use unflavored protein powder in any of your liquids -have greek yogurt -continue to get in a minimum of 64 fluid ounces per day  Handouts Provided Include    Learning Style & Readiness for Change Teaching method utilized: Visual & Auditory  Demonstrated degree of understanding via: Teach Back  Readiness Level: action Barriers to learning/adherence to lifestyle change: GI disturbances inclusive of severe constipation  RD's Notes for Next Visit Assess adherence to pt chosen goals    MONITORING & EVALUATION Dietary intake, weekly physical activity, body weight  Next Steps Patient is to  follow-up after the surgeon workup

## 2021-11-22 ENCOUNTER — Other Ambulatory Visit: Payer: Self-pay | Admitting: Student

## 2021-11-22 DIAGNOSIS — R1310 Dysphagia, unspecified: Secondary | ICD-10-CM

## 2021-11-28 ENCOUNTER — Telehealth: Payer: Self-pay | Admitting: Skilled Nursing Facility1

## 2021-11-28 NOTE — Telephone Encounter (Signed)
Called pt to check in on her status.  Pt states she has her upper this week. Pt states her doctor tells her the fibroids are not causing her constipation.   Pt states she has been trying the bowel regimen. Pt states she did have one bowel movement last Thursday so 5 days since a bowel movement.  Pt states she did only do the regimen 2 times in a week.   Dietitian read the note to pt from her surgeon stating he wants her to do the milk of magnesia 2 adult doses 2 times a day for 2 days; Pt states she will start this today and do it correctly.

## 2021-12-01 ENCOUNTER — Other Ambulatory Visit (HOSPITAL_COMMUNITY): Payer: Self-pay

## 2021-12-01 ENCOUNTER — Ambulatory Visit
Admission: RE | Admit: 2021-12-01 | Discharge: 2021-12-01 | Disposition: A | Discharge status: Home / Self Care | Payer: BC Managed Care – PPO | Source: Ambulatory Visit | Attending: Student | Admitting: Student

## 2021-12-01 ENCOUNTER — Other Ambulatory Visit: Payer: Self-pay

## 2021-12-01 DIAGNOSIS — R1310 Dysphagia, unspecified: Secondary | ICD-10-CM

## 2021-12-04 ENCOUNTER — Telehealth: Payer: Self-pay | Admitting: Skilled Nursing Facility1

## 2021-12-04 NOTE — Telephone Encounter (Signed)
Called pt to check in on her status.   Pt states she Has had bowel movements.   Pt state she was advised the upper GI came back without issue and is still just waiting on the call from her surgeons office.   Pt states she tried a piece of a pork chop which she felt took a while to digest.

## 2022-02-05 ENCOUNTER — Encounter: Payer: BC Managed Care – PPO | Attending: General Surgery | Admitting: Skilled Nursing Facility1

## 2022-02-05 DIAGNOSIS — R7303 Prediabetes: Secondary | ICD-10-CM | POA: Diagnosis not present

## 2022-02-05 DIAGNOSIS — K219 Gastro-esophageal reflux disease without esophagitis: Secondary | ICD-10-CM | POA: Diagnosis not present

## 2022-02-05 DIAGNOSIS — Z713 Dietary counseling and surveillance: Secondary | ICD-10-CM | POA: Insufficient documentation

## 2022-02-05 DIAGNOSIS — E669 Obesity, unspecified: Secondary | ICD-10-CM | POA: Diagnosis present

## 2022-02-05 NOTE — Progress Notes (Signed)
Bariatric Nutrition Follow-Up Visit ?Medical Nutrition Therapy  ? ? ?NUTRITION ASSESSMENT ?  ? ?Anthropometrics  ?Surgery date: 09/25/2021 ?Surgery type: RYGB ?Start weight at [facility]: 294.1 lbs ?Tracy Morris today: 212 pounds ? ?Body Composition Scale 07/17/2021 02/05/2022  ?Current Body Weight 291.7 212  ?Total Body Fat % 48.9 41.4  ?Visceral Fat 18 12  ?Fat-Free Mass % 51 58.5  ? Total Body Water % 40 43.7  ?Muscle-Mass lbs 31.9 31  ?BMI 52.6 38.1  ?Body Fat Displacement    ?       Torso  lbs 88.6 54.3  ?       Left Leg  lbs 17.7 10.8  ?       Right Leg  lbs 17.7 10.8  ?       Left Arm  lbs 8.8 5.4  ?       Right Arm   lbs 8.8 5.4  ? ?Clinical  ?Medical hx: DM, GERD ?Medications: Ozempic  ?Labs: BUN/creatinine ratio 8, vitmain D 16.7, B1 61.2, TIBC 182, UIBC 118 ?Notable signs/symptoms: reflux, constipation  ?Any previous deficiencies? No ?Lifestyle & Dietary Hx ? ?Pt state she was recently found to have a hiatal hernia. ?Pt states she gets full quickly. ?Pt states she can tolerate most protein foods, now.  Currently struggling with a fear that most foods will make her sick. ?No longer having bowel issues. ? ? ?Estimated daily fluid intake: 100 oz ?Estimated daily protein intake: 64 g ?Supplements: multi and calcium  ?Current average weekly physical activity: ADL's  ? ?24-Hr Dietary Recall: wakes around 7am ?First Meal 8: peanuts (1 cup) ?Snack:   ?Second Meal 12-12:30: beans + mustard sometimes with onion or salad: cheese, onion, green pepper, black olives  ?Snack:   ?Third Meal: sometimes nutrigrain bar or peanuts  ?Snack:  ?Beverages: Gatorade zero, water, water + flavorings  ? ?Post-Op Goals/ Signs/ Symptoms ?Using straws: no ?Drinking while eating: no ?Chewing/swallowing difficulties: no ?Changes in vision: no ?Changes to mood/headaches: no ?Hair loss/changes to skin/nails: no ?Difficulty focusing/concentrating: no ?Sweating: no ?Limb weakness: no ?Dizziness/lightheadedness: no ?Palpitations: no   ?Carbonated/caffeinated beverages: no ?N/V/D/C/Gas: every other day bowel movement ?Abdominal pain: no ?Dumping syndrome: no ? ?  ?NUTRITION DIAGNOSIS  ?Overweight/obesity ([place]-3.3) related to past poor dietary habits and physical inactivity as evidenced by completed bariatric surgery and following dietary guidelines for continued weight loss and healthy nutrition status. ?  ?  ?NUTRITION INTERVENTION ?Nutrition counseling (C-1) and education (E-2) to facilitate bariatric surgery goals, including: ? ?Goals: ?-Vitamin D, 1000 IU per day ?-Vitamin B1, recommended by PA, ?-Eat breakfast, lunch and dinner ?-Reduce all fluids to 64 ounces to increase food intake ?-Add deli meat to salad ?-Add [place] meat to beans  ? ?Handouts Provided Include  ? ? ?Learning Style & Readiness for Change ?Teaching method utilized: Visual & Auditory  ?Demonstrated degree of understanding via: Teach Back  ?Readiness Level: action ?Barriers to learning/adherence to lifestyle change: GI disturbances inclusive of severe constipation ? ?RD's Notes for Next Visit ?Assess adherence to pt chosen goals  ? ? ?MONITORING & EVALUATION ?Dietary intake, weekly physical activity, body weight ? ?Next Steps ?Patient is to follow-up after the surgeon workup  ?

## 2022-05-05 ENCOUNTER — Other Ambulatory Visit: Payer: Self-pay

## 2022-05-05 ENCOUNTER — Encounter (HOSPITAL_COMMUNITY): Payer: Self-pay

## 2022-05-05 ENCOUNTER — Emergency Department (HOSPITAL_COMMUNITY)
Admission: EM | Admit: 2022-05-05 | Discharge: 2022-05-06 | Disposition: A | Discharge status: Home / Self Care | Payer: BC Managed Care – PPO | Attending: Emergency Medicine | Admitting: Emergency Medicine

## 2022-05-05 DIAGNOSIS — R1013 Epigastric pain: Secondary | ICD-10-CM

## 2022-05-05 DIAGNOSIS — K8020 Calculus of gallbladder without cholecystitis without obstruction: Secondary | ICD-10-CM | POA: Diagnosis not present

## 2022-05-05 DIAGNOSIS — R112 Nausea with vomiting, unspecified: Secondary | ICD-10-CM | POA: Diagnosis present

## 2022-05-05 MED ORDER — FENTANYL CITRATE PF 50 MCG/ML IJ SOSY
50.0000 ug | PREFILLED_SYRINGE | Freq: Once | INTRAMUSCULAR | Status: AC
  Administered 2022-05-06: 50 ug via INTRAVENOUS
  Filled 2022-05-05: qty 1

## 2022-05-05 MED ORDER — ONDANSETRON HCL 4 MG/2ML IJ SOLN
4.0000 mg | Freq: Once | INTRAMUSCULAR | Status: AC
Start: 2022-05-06 — End: 2022-05-06
  Administered 2022-05-06: 4 mg via INTRAVENOUS
  Filled 2022-05-05: qty 2

## 2022-05-05 NOTE — ED Triage Notes (Signed)
Pt reports sharp upper abd pain onset one hour PTA associated with emesis x 2. She reports gastric bypass surgery in December. Tonight for dinner she had grilled chicken and broccoli.

## 2022-05-05 NOTE — ED Notes (Signed)
Rob, PA at bedside.

## 2022-05-06 ENCOUNTER — Emergency Department (HOSPITAL_COMMUNITY): Payer: BC Managed Care – PPO

## 2022-05-06 LAB — CBC WITH DIFFERENTIAL/PLATELET
Abs Immature Granulocytes: 0.04 10*3/uL (ref 0.00–0.07)
Basophils Absolute: 0 10*3/uL (ref 0.0–0.1)
Basophils Relative: 0 %
Eosinophils Absolute: 0.1 10*3/uL (ref 0.0–0.5)
Eosinophils Relative: 2 %
HCT: 34.9 % — ABNORMAL LOW (ref 36.0–46.0)
Hemoglobin: 11.2 g/dL — ABNORMAL LOW (ref 12.0–15.0)
Immature Granulocytes: 1 %
Lymphocytes Relative: 31 %
Lymphs Abs: 1.5 10*3/uL (ref 0.7–4.0)
MCH: 27.3 pg (ref 26.0–34.0)
MCHC: 32.1 g/dL (ref 30.0–36.0)
MCV: 85.1 fL (ref 80.0–100.0)
Monocytes Absolute: 0.4 10*3/uL (ref 0.1–1.0)
Monocytes Relative: 7 %
Neutro Abs: 3 10*3/uL (ref 1.7–7.7)
Neutrophils Relative %: 59 %
Platelets: 254 10*3/uL (ref 150–400)
RBC: 4.1 MIL/uL (ref 3.87–5.11)
RDW: 14.7 % (ref 11.5–15.5)
WBC: 5 10*3/uL (ref 4.0–10.5)
nRBC: 0 % (ref 0.0–0.2)

## 2022-05-06 LAB — URINALYSIS, ROUTINE W REFLEX MICROSCOPIC
Bacteria, UA: NONE SEEN
Bilirubin Urine: NEGATIVE
Glucose, UA: NEGATIVE mg/dL
Ketones, ur: 20 mg/dL — AB
Leukocytes,Ua: NEGATIVE
Nitrite: NEGATIVE
Protein, ur: NEGATIVE mg/dL
RBC / HPF: >50 RBC/hpf — ABNORMAL HIGH (ref 0–5)
Specific Gravity, Urine: >1.046 — ABNORMAL HIGH (ref 1.005–1.030)
pH: 5 (ref 5.0–8.0)

## 2022-05-06 LAB — COMPREHENSIVE METABOLIC PANEL
ALT: 16 U/L (ref 0–44)
AST: 13 U/L — ABNORMAL LOW (ref 15–41)
Albumin: 3.5 g/dL (ref 3.5–5.0)
Alkaline Phosphatase: 66 U/L (ref 38–126)
Anion gap: 8 (ref 5–15)
BUN: 7 mg/dL (ref 6–20)
CO2: 26 mmol/L (ref 22–32)
Calcium: 8.8 mg/dL — ABNORMAL LOW (ref 8.9–10.3)
Chloride: 108 mmol/L (ref 98–111)
Creatinine, Ser: 0.85 mg/dL (ref 0.44–1.00)
GFR, Estimated: >60 mL/min (ref >60)
Glucose, Bld: 110 mg/dL — ABNORMAL HIGH (ref 70–99)
Potassium: 3.3 mmol/L — ABNORMAL LOW (ref 3.5–5.1)
Sodium: 142 mmol/L (ref 135–145)
Total Bilirubin: 0.5 mg/dL (ref 0.3–1.2)
Total Protein: 6.7 g/dL (ref 6.5–8.1)

## 2022-05-06 LAB — LIPASE, BLOOD: Lipase: 22 U/L (ref 11–51)

## 2022-05-06 LAB — I-STAT BETA HCG BLOOD, ED (MC, WL, AP ONLY): I-stat hCG, quantitative: <5 m[IU]/mL (ref <5)

## 2022-05-06 MED ORDER — LACTATED RINGERS IV BOLUS
1000.0000 mL | Freq: Once | INTRAVENOUS | Status: AC
Start: 2022-05-06 — End: 2022-05-06
  Administered 2022-05-06: 1000 mL via INTRAVENOUS

## 2022-05-06 MED ORDER — FENTANYL CITRATE PF 50 MCG/ML IJ SOSY
50.0000 ug | PREFILLED_SYRINGE | Freq: Once | INTRAMUSCULAR | Status: AC
  Administered 2022-05-06: 50 ug via INTRAVENOUS
  Filled 2022-05-06: qty 1

## 2022-05-06 MED ORDER — ONDANSETRON 4 MG PO TBDP: 4.0000 mg | ORAL_TABLET | Freq: Three times a day (TID) | ORAL | 0 refills | Status: AC | PRN

## 2022-05-06 MED ORDER — HYDROMORPHONE HCL 1 MG/ML IJ SOLN
0.5000 mg | Freq: Once | INTRAMUSCULAR | Status: AC
Start: 2022-05-06 — End: 2022-05-06
  Administered 2022-05-06: 0.5 mg via INTRAVENOUS
  Filled 2022-05-06: qty 1

## 2022-05-06 MED ORDER — SODIUM CHLORIDE (PF) 0.9 % IJ SOLN
INTRAMUSCULAR | Status: AC
  Filled 2022-05-06: qty 50

## 2022-05-06 MED ORDER — HYDROMORPHONE HCL 1 MG/ML IJ SOLN
0.5000 mg | Freq: Once | INTRAMUSCULAR | Status: AC
  Administered 2022-05-06: 0.5 mg via INTRAVENOUS
  Filled 2022-05-06: qty 1

## 2022-05-06 MED ORDER — HYDROCODONE-ACETAMINOPHEN 5-325 MG PO TABS: 1.0000 | ORAL_TABLET | Freq: Four times a day (QID) | ORAL | 0 refills | Status: DC | PRN

## 2022-05-06 MED ORDER — HYDROCODONE-ACETAMINOPHEN 5-325 MG PO TABS
2.0000 | ORAL_TABLET | Freq: Once | ORAL | Status: AC
  Administered 2022-05-06: 2 via ORAL
  Filled 2022-05-06: qty 2

## 2022-05-06 MED ORDER — SODIUM CHLORIDE 0.9 % IV BOLUS
1000.0000 mL | Freq: Once | INTRAVENOUS | Status: AC
  Administered 2022-05-06: 1000 mL via INTRAVENOUS

## 2022-05-06 MED ORDER — FAMOTIDINE IN NACL 20-0.9 MG/50ML-% IV SOLN
20.0000 mg | Freq: Once | INTRAVENOUS | Status: AC
Start: 2022-05-06 — End: 2022-05-06
  Administered 2022-05-06: 20 mg via INTRAVENOUS
  Filled 2022-05-06: qty 50

## 2022-05-06 MED ORDER — IOHEXOL 300 MG/ML  SOLN
100.0000 mL | Freq: Once | INTRAMUSCULAR | Status: AC | PRN
  Administered 2022-05-06: 100 mL via INTRAVENOUS

## 2022-05-06 NOTE — ED Notes (Addendum)
The patient states her pain is coming back and is requesting more pain medication. Rob, PA informed via epic secure chat.

## 2022-05-06 NOTE — ED Notes (Signed)
Pt given cup of water for fluid challenge

## 2022-05-06 NOTE — ED Notes (Signed)
Pt ambulatory to restroom with independent steady gait °

## 2022-05-06 NOTE — ED Notes (Signed)
Pt reminded a urine sample is needed. She reports she will try to urinate in 5 minutes.

## 2022-05-06 NOTE — ED Notes (Signed)
US at bedside

## 2022-05-06 NOTE — ED Notes (Signed)
Patient transported to CT 

## 2022-05-06 NOTE — ED Notes (Signed)
Pt reports she does not feel like her pain is under enough control to be discharged home at this time. Rob, [place] informed.

## 2022-05-06 NOTE — ED Provider Notes (Signed)
[facility] Emergency Department Provider Note MRN:  [id number]  Arrival date & time: 05/06/22     Chief Complaint   Abdominal Pain   History of Present Illness   Tracy Morris is a 36 y.o. year-old female presents to the ED with chief complaint of abdominal pain and nausea and vomiting.  Patient has history of Roux-en-Y gastric bypass in December 2022.  She states that she ate chicken and broccoli tonight and has been having persistent vomiting as well as severe epigastric abdominal pain.  She denies any diarrhea.  She denies fevers or chills.  Denies any other associated symptoms..  History provided by patient.   Review of Systems  Pertinent review of systems noted in HPI.    Physical Exam   Vitals:   05/06/22 0500 05/06/22 0518  BP: 102/64   Pulse: (!) 59   Resp: 18   Temp:  98.2 F (36.8 C)  SpO2: 98%     CONSTITUTIONAL:  uncomfortable-appearing, NAD NEURO:  Alert and oriented x 3, CN 3-12 grossly intact EYES:  eyes equal and reactive ENT/NECK:  Supple, no stridor  CARDIO:  normal rate, regular rhythm, appears well-perfused  PULM:  No respiratory distress,  GI/GU:  non-distended, epigastric tenderness MSK/SPINE:  No gross deformities, no edema, moves all extremities  SKIN:  no rash, atraumatic   *Additional and/or pertinent findings included in MDM below  Diagnostic and Interventional Summary    EKG Interpretation  Date/Time:    Ventricular Rate:    PR Interval:    QRS Duration:   QT Interval:    QTC Calculation:   R Axis:     Text Interpretation:         Labs Reviewed  COMPREHENSIVE METABOLIC PANEL - Abnormal; Notable for the following components:      Result Value   Potassium 3.3 (*)    Glucose, Bld 110 (*)    Calcium 8.8 (*)    AST 13 (*)    All other components within normal limits  URINALYSIS, ROUTINE W REFLEX MICROSCOPIC - Abnormal; Notable for the following components:   APPearance HAZY (*)    Specific Gravity,  Urine >1.046 (*)    Hgb urine dipstick LARGE (*)    Ketones, ur 20 (*)    RBC / HPF >50 (*)    All other components within normal limits  CBC WITH DIFFERENTIAL/PLATELET - Abnormal; Notable for the following components:   Hemoglobin 11.2 (*)    HCT 34.9 (*)    All other components within normal limits  LIPASE, BLOOD  I-STAT BETA HCG BLOOD, ED ([facility], [facility], [facility] ONLY)    US Abdomen Limited  Final Result    CT ABDOMEN PELVIS W CONTRAST  Final Result      Medications  sodium chloride (PF) 0.9 % injection (  Not Given 05/06/22 0115)  fentaNYL (SUBLIMAZE) injection 50 mcg (50 mcg Intravenous Given 05/06/22 0007)  ondansetron (ZOFRAN) injection 4 mg (4 mg Intravenous Given 05/06/22 0006)  sodium chloride 0.9 % bolus 1,000 mL (0 mLs Intravenous Stopped 05/06/22 0245)  iohexol (OMNIPAQUE) 300 MG/ML solution 100 mL (100 mLs Intravenous Contrast Given 05/06/22 0048)  fentaNYL (SUBLIMAZE) injection 50 mcg (50 mcg Intravenous Given 05/06/22 0137)  famotidine (PEPCID) IVPB 20 mg premix (0 mg Intravenous Stopped 05/06/22 0245)  HYDROmorphone (DILAUDID) injection 0.5 mg (0.5 mg Intravenous Given 05/06/22 0213)  HYDROcodone-acetaminophen (NORCO/VICODIN) 5-325 MG per tablet 2 tablet (2 tablets Oral Given 05/06/22 0247)  HYDROmorphone (DILAUDID) injection 0.5 mg (  0.5 mg Intravenous Given 05/06/22 0345)  lactated ringers bolus 1,000 mL (0 mLs Intravenous Stopped 05/06/22 0517)     Procedures  /  Critical Care Procedures  ED Course and Medical Decision Making  I have reviewed the triage vital signs, the nursing notes, and pertinent available records from the EMR.  Social Determinants Affecting Complexity of Care: Patient has no clinically significant social determinants affecting this chief complaint..   ED Course:   Patient here with epigastric pain and vomiting.  Top differential diagnoses include gastritis, gastroenteritis, cholecystitis, pancreatitis, bowel obstruction, postoperative complication. Medical  Decision Making patient here with history of gastric bypass now having epigastric pain and vomiting.  Labs discussed as below.  CT imaging notable for no obstruction, but does show gallbladder sludge.  I proceeded to get ultrasound of the right upper quadrant which showed gallstones, but no cholecystitis.  Patient's pain continued to improve throughout the ED visit.  She was given additional pain medicine.  Patient ultimately requested discharge and felt comfortable with outpatient follow-up.  I sent an inbox message to her surgical team to see if they can schedule follow-up for the gallstones.  Amount and/or Complexity of Data Reviewed Labs: ordered.    Details: Mild hypokalemia at 3.3, no other significant electrolyte derangement, lipase is normal, doubt pancreatitis, no significant leukocytosis, doubt infection Radiology: ordered and independent interpretation performed.    Details: Gallstones and sludge present on ultrasound, no free air on CT  Risk Prescription drug management. Decision regarding hospitalization.     Consultants: No consultations were needed in caring for this patient.   Treatment and Plan: I considered admission due to patient's initial presentation, but after considering the examination and diagnostic results, patient will not require admission and can be discharged with outpatient follow-up.    Final Clinical Impressions(s) / ED Diagnoses     ICD-10-CM   1. Calculus of gallbladder without cholecystitis without obstruction  K80.20     2. Epigastric pain  R10.13       ED Discharge Orders          Ordered    HYDROcodone-acetaminophen (NORCO/VICODIN) 5-325 MG tablet  Every 6 hours PRN        05/06/22 0440    ondansetron (ZOFRAN-ODT) 4 MG disintegrating tablet  Every 8 hours PRN        05/06/22 0440              Discharge Instructions Discussed with and Provided to Patient:     Discharge Instructions      Please follow-up with your  surgeon.  I sent him a message through our computer system so that he can easily access your results from tonight.  If your symptoms change or worsen, please return to the emergency department.  Take pain medication as directed.       Montine Circle, PA-C 05/06/22 0601    Molpus, Jenny Reichmann, MD 05/06/22 [phone]

## 2022-05-06 NOTE — Discharge Instructions (Signed)
Please follow-up with your surgeon.  I sent him a message through our computer system so that he can easily access your results from tonight.  If your symptoms change or worsen, please return to the emergency department.  Take pain medication as directed.

## 2022-05-17 ENCOUNTER — Ambulatory Visit: Payer: Self-pay | Admitting: General Surgery

## 2022-05-17 NOTE — Progress Notes (Signed)
Surgery orders requested via Epic inbox. °

## 2022-05-18 ENCOUNTER — Other Ambulatory Visit: Payer: Self-pay

## 2022-05-18 ENCOUNTER — Encounter (HOSPITAL_COMMUNITY): Payer: Self-pay | Admitting: General Surgery

## 2022-05-18 NOTE — Progress Notes (Signed)
COVID Vaccine Completed:  Yes x3  Date of COVID positive in last 90 days:  No  PCP - Sela Hilding, MD Cardiologist - N/A  Chest x-ray - 07-14-21 Epic EKG - 07-14-21 Epic Stress Test - N/A ECHO - N/A Cardiac Cath - N/A Pacemaker/ICD device last checked: Spinal Cord Stimulator:N/A  Bowel Prep - N/A  Sleep Study - N/A CPAP -   Prediabetes Fasting Blood Sugar -  Checks Blood Sugar - does not check   Blood Thinner Instructions:  N/A Aspirin Instructions: Last Dose:  Activity level:   Can go up a flight of stairs and perform activities of daily living without stopping and without symptoms of chest pain or shortness of breath.  Anesthesia review: N/A  Patient denies shortness of breath, fever, cough and chest pain at PAT appointment  Patient verbalized understanding of instructions that were given to them at the PAT appointment. Patient was also instructed that they will need to review over the PAT instructions again at home before surgery.

## 2022-05-21 ENCOUNTER — Other Ambulatory Visit: Payer: Self-pay

## 2022-05-21 ENCOUNTER — Ambulatory Visit (HOSPITAL_COMMUNITY): Payer: BC Managed Care – PPO

## 2022-05-21 ENCOUNTER — Ambulatory Visit (HOSPITAL_COMMUNITY)
Admission: RE | Admit: 2022-05-21 | Discharge: 2022-05-21 | Disposition: A | Discharge status: Home / Self Care | Payer: BC Managed Care – PPO | Attending: General Surgery | Admitting: General Surgery

## 2022-05-21 ENCOUNTER — Encounter (HOSPITAL_COMMUNITY): Payer: Self-pay | Admitting: General Surgery

## 2022-05-21 ENCOUNTER — Ambulatory Visit (HOSPITAL_COMMUNITY): Payer: BC Managed Care – PPO | Admitting: Anesthesiology

## 2022-05-21 ENCOUNTER — Encounter (HOSPITAL_COMMUNITY)
Admission: RE | Disposition: A | Discharge status: Home / Self Care | Payer: Self-pay | Source: Home / Self Care | Attending: General Surgery

## 2022-05-21 DIAGNOSIS — E519 Thiamine deficiency, unspecified: Secondary | ICD-10-CM | POA: Diagnosis not present

## 2022-05-21 DIAGNOSIS — E559 Vitamin D deficiency, unspecified: Secondary | ICD-10-CM | POA: Diagnosis not present

## 2022-05-21 DIAGNOSIS — Z79899 Other long term (current) drug therapy: Secondary | ICD-10-CM | POA: Insufficient documentation

## 2022-05-21 DIAGNOSIS — K8010 Calculus of gallbladder with chronic cholecystitis without obstruction: Secondary | ICD-10-CM | POA: Insufficient documentation

## 2022-05-21 DIAGNOSIS — Z01818 Encounter for other preprocedural examination: Secondary | ICD-10-CM

## 2022-05-21 DIAGNOSIS — K219 Gastro-esophageal reflux disease without esophagitis: Secondary | ICD-10-CM | POA: Diagnosis not present

## 2022-05-21 DIAGNOSIS — R7303 Prediabetes: Secondary | ICD-10-CM

## 2022-05-21 DIAGNOSIS — Z9884 Bariatric surgery status: Secondary | ICD-10-CM | POA: Insufficient documentation

## 2022-05-21 DIAGNOSIS — K8020 Calculus of gallbladder without cholecystitis without obstruction: Secondary | ICD-10-CM | POA: Diagnosis present

## 2022-05-21 DIAGNOSIS — Z6833 Body mass index (BMI) 33.0-33.9, adult: Secondary | ICD-10-CM | POA: Insufficient documentation

## 2022-05-21 DIAGNOSIS — K660 Peritoneal adhesions (postprocedural) (postinfection): Secondary | ICD-10-CM | POA: Diagnosis not present

## 2022-05-21 DIAGNOSIS — E6609 Other obesity due to excess calories: Secondary | ICD-10-CM | POA: Insufficient documentation

## 2022-05-21 HISTORY — PX: CHOLECYSTECTOMY: SHX55

## 2022-05-21 LAB — POCT PREGNANCY, URINE: Preg Test, Ur: NEGATIVE

## 2022-05-21 LAB — HEMOGLOBIN A1C
Hgb A1c MFr Bld: 4.8 % (ref 4.8–5.6)
Mean Plasma Glucose: 91.06 mg/dL

## 2022-05-21 SURGERY — LAPAROSCOPIC CHOLECYSTECTOMY WITH INTRAOPERATIVE CHOLANGIOGRAM
Anesthesia: General | Site: Abdomen

## 2022-05-21 MED ORDER — PROPOFOL 10 MG/ML IV BOLUS
INTRAVENOUS | Status: AC
  Filled 2022-05-21: qty 20

## 2022-05-21 MED ORDER — PROPOFOL 10 MG/ML IV BOLUS
INTRAVENOUS | Status: DC | PRN
  Administered 2022-05-21: 150 mg via INTRAVENOUS
  Administered 2022-05-21: 50 mg via INTRAVENOUS

## 2022-05-21 MED ORDER — ACETAMINOPHEN 500 MG PO TABS
1000.0000 mg | ORAL_TABLET | ORAL | Status: AC
  Administered 2022-05-21: 1000 mg via ORAL
  Filled 2022-05-21: qty 2

## 2022-05-21 MED ORDER — FENTANYL CITRATE PF 50 MCG/ML IJ SOSY
25.0000 ug | PREFILLED_SYRINGE | INTRAMUSCULAR | Status: DC | PRN
  Administered 2022-05-21 (×4): 25 ug via INTRAVENOUS

## 2022-05-21 MED ORDER — 0.9 % SODIUM CHLORIDE (POUR BTL) OPTIME
TOPICAL | Status: DC | PRN
  Administered 2022-05-21: 1000 mL

## 2022-05-21 MED ORDER — ONDANSETRON HCL 4 MG/2ML IJ SOLN
INTRAMUSCULAR | Status: AC
  Filled 2022-05-21: qty 2

## 2022-05-21 MED ORDER — ONDANSETRON HCL 4 MG/2ML IJ SOLN
INTRAMUSCULAR | Status: DC | PRN
  Administered 2022-05-21: 4 mg via INTRAVENOUS

## 2022-05-21 MED ORDER — ACETAMINOPHEN 500 MG PO TABS: 1000.0000 mg | ORAL_TABLET | Freq: Three times a day (TID) | ORAL | 0 refills | Status: AC

## 2022-05-21 MED ORDER — LIDOCAINE HCL (PF) 2 % IJ SOLN
INTRAMUSCULAR | Status: AC
  Filled 2022-05-21: qty 5

## 2022-05-21 MED ORDER — OXYCODONE HCL 5 MG PO TABS
ORAL_TABLET | ORAL | Status: AC
  Filled 2022-05-21: qty 1

## 2022-05-21 MED ORDER — LACTATED RINGERS IR SOLN
Status: DC | PRN
  Administered 2022-05-21: 1000 mL

## 2022-05-21 MED ORDER — CHLORHEXIDINE GLUCONATE 0.12 % MT SOLN
15.0000 mL | Freq: Once | OROMUCOSAL | Status: AC
  Administered 2022-05-21: 15 mL via OROMUCOSAL

## 2022-05-21 MED ORDER — FENTANYL CITRATE PF 50 MCG/ML IJ SOSY
PREFILLED_SYRINGE | INTRAMUSCULAR | Status: AC
  Filled 2022-05-21: qty 1

## 2022-05-21 MED ORDER — BUPIVACAINE-EPINEPHRINE (PF) 0.25% -1:200000 IJ SOLN
INTRAMUSCULAR | Status: AC
  Filled 2022-05-21: qty 30

## 2022-05-21 MED ORDER — OXYCODONE HCL 5 MG PO TABS: 5.0000 mg | ORAL_TABLET | Freq: Four times a day (QID) | ORAL | 0 refills | Status: AC | PRN

## 2022-05-21 MED ORDER — BUPIVACAINE-EPINEPHRINE 0.25% -1:200000 IJ SOLN
INTRAMUSCULAR | Status: DC | PRN
  Administered 2022-05-21: 30 mL

## 2022-05-21 MED ORDER — DEXAMETHASONE SODIUM PHOSPHATE 10 MG/ML IJ SOLN
INTRAMUSCULAR | Status: AC
  Filled 2022-05-21: qty 1

## 2022-05-21 MED ORDER — SUGAMMADEX SODIUM 200 MG/2ML IV SOLN
INTRAVENOUS | Status: DC | PRN
  Administered 2022-05-21: 200 mg via INTRAVENOUS

## 2022-05-21 MED ORDER — MIDAZOLAM HCL 2 MG/2ML IJ SOLN
INTRAMUSCULAR | Status: DC | PRN
  Administered 2022-05-21: 2 mg via INTRAVENOUS

## 2022-05-21 MED ORDER — MIDAZOLAM HCL 2 MG/2ML IJ SOLN
INTRAMUSCULAR | Status: AC
  Filled 2022-05-21: qty 2

## 2022-05-21 MED ORDER — ROCURONIUM BROMIDE 10 MG/ML (PF) SYRINGE
PREFILLED_SYRINGE | INTRAVENOUS | Status: AC
  Filled 2022-05-21: qty 10

## 2022-05-21 MED ORDER — SODIUM CHLORIDE 0.9 % IV SOLN
2.0000 g | INTRAVENOUS | Status: AC
  Administered 2022-05-21: 2 g via INTRAVENOUS
  Filled 2022-05-21: qty 2

## 2022-05-21 MED ORDER — FENTANYL CITRATE (PF) 250 MCG/5ML IJ SOLN
INTRAMUSCULAR | Status: AC
  Filled 2022-05-21: qty 5

## 2022-05-21 MED ORDER — AMISULPRIDE (ANTIEMETIC) 5 MG/2ML IV SOLN: 10.0000 mg | Freq: Once | INTRAVENOUS | Status: DC | PRN

## 2022-05-21 MED ORDER — CHLORHEXIDINE GLUCONATE CLOTH 2 % EX PADS: 6.0000 | MEDICATED_PAD | Freq: Once | CUTANEOUS | Status: DC

## 2022-05-21 MED ORDER — AMOXICILLIN 500 MG PO TABS
500.0000 mg | ORAL_TABLET | Freq: Two times a day (BID) | ORAL | 0 refills | Status: AC
Start: 2022-05-21 — End: 2022-05-26

## 2022-05-21 MED ORDER — LIDOCAINE 2% (20 MG/ML) 5 ML SYRINGE
INTRAMUSCULAR | Status: DC | PRN
  Administered 2022-05-21: 100 mg via INTRAVENOUS
  Administered 2022-05-21: 60 mg via INTRAVENOUS

## 2022-05-21 MED ORDER — OXYCODONE HCL 5 MG PO TABS
5.0000 mg | ORAL_TABLET | Freq: Once | ORAL | Status: AC
  Administered 2022-05-21: 5 mg via ORAL

## 2022-05-21 MED ORDER — ROCURONIUM BROMIDE 10 MG/ML (PF) SYRINGE
PREFILLED_SYRINGE | INTRAVENOUS | Status: DC | PRN
  Administered 2022-05-21: 10 mg via INTRAVENOUS
  Administered 2022-05-21: 50 mg via INTRAVENOUS
  Administered 2022-05-21: 20 mg via INTRAVENOUS

## 2022-05-21 MED ORDER — LACTATED RINGERS IV SOLN: INTRAVENOUS | Status: DC

## 2022-05-21 MED ORDER — ORAL CARE MOUTH RINSE
15.0000 mL | Freq: Once | OROMUCOSAL | Status: AC
Start: 2022-05-21 — End: 2022-05-21

## 2022-05-21 MED ORDER — DEXAMETHASONE SODIUM PHOSPHATE 10 MG/ML IJ SOLN
INTRAMUSCULAR | Status: DC | PRN
  Administered 2022-05-21: 10 mg via INTRAVENOUS

## 2022-05-21 MED ORDER — FENTANYL CITRATE (PF) 250 MCG/5ML IJ SOLN
INTRAMUSCULAR | Status: DC | PRN
  Administered 2022-05-21 (×2): 50 ug via INTRAVENOUS
  Administered 2022-05-21: 100 ug via INTRAVENOUS
  Administered 2022-05-21: 50 ug via INTRAVENOUS

## 2022-05-21 SURGICAL SUPPLY — 68 items
ADH SKN CLS APL DERMABOND .7 (GAUZE/BANDAGES/DRESSINGS)
APL PRP STRL LF DISP 70% ISPRP (MISCELLANEOUS) ×1
APL SKNCLS STERI-STRIP NONHPOA (GAUZE/BANDAGES/DRESSINGS)
APL SRG 38 LTWT LNG FL B (MISCELLANEOUS)
APPLICATOR ARISTA FLEXITIP XL (MISCELLANEOUS) IMPLANT
APPLIER CLIP 5 13 M/L LIGAMAX5 (MISCELLANEOUS) ×2
APPLIER CLIP ROT 10 11.4 M/L (STAPLE)
APR CLP MED LRG 11.4X10 (STAPLE)
APR CLP MED LRG 5 ANG JAW (MISCELLANEOUS) ×1
BAG COUNTER SPONGE SURGICOUNT (BAG) IMPLANT
BAG SPEC RTRVL 10 TROC 200 (ENDOMECHANICALS) ×1
BAG SPNG CNTER NS LX DISP (BAG)
BENZOIN TINCTURE PRP APPL 2/3 (GAUZE/BANDAGES/DRESSINGS) IMPLANT
BNDG ADH 1X3 SHEER STRL LF (GAUZE/BANDAGES/DRESSINGS) ×8 IMPLANT
BNDG ADH THN 3X1 STRL LF (GAUZE/BANDAGES/DRESSINGS) ×4
CABLE HIGH FREQUENCY MONO STRZ (ELECTRODE) ×2 IMPLANT
CHLORAPREP W/TINT 26 (MISCELLANEOUS) ×2 IMPLANT
CLIP APPLIE 5 13 M/L LIGAMAX5 (MISCELLANEOUS) IMPLANT
CLIP APPLIE ROT 10 11.4 M/L (STAPLE) IMPLANT
CLIP LIGATING HEMO O LOK GREEN (MISCELLANEOUS) IMPLANT
COVER MAYO STAND XLG (MISCELLANEOUS) ×2 IMPLANT
COVER SURGICAL LIGHT HANDLE (MISCELLANEOUS) ×2 IMPLANT
DERMABOND ADVANCED (GAUZE/BANDAGES/DRESSINGS)
DERMABOND ADVANCED .7 DNX12 (GAUZE/BANDAGES/DRESSINGS) IMPLANT
DRAPE C-ARM 42X120 X-RAY (DRAPES) ×1 IMPLANT
DRSG IV TEGADERM 3.5X4.5 STRL (GAUZE/BANDAGES/DRESSINGS) ×1 IMPLANT
DRSG TEGADERM 2-3/8X2-3/4 SM (GAUZE/BANDAGES/DRESSINGS) ×3 IMPLANT
DRSG TEGADERM 4X4.75 (GAUZE/BANDAGES/DRESSINGS) ×1 IMPLANT
ELECT REM PT RETURN 15FT ADLT (MISCELLANEOUS) ×2 IMPLANT
GAUZE SPONGE 2X2 8PLY STRL LF (GAUZE/BANDAGES/DRESSINGS) IMPLANT
GLOVE BIO SURGEON STRL SZ7.5 (GLOVE) ×2 IMPLANT
GLOVE BIOGEL PI IND STRL 8 (GLOVE) ×1 IMPLANT
GLOVE BIOGEL PI INDICATOR 8 (GLOVE) ×1
GLOVE BIOGEL PI ORTHO PRO 7.5 (GLOVE) ×1
GLOVE PI ORTHO PRO STRL 7.5 (GLOVE) ×1 IMPLANT
GLOVE SS BIOGEL STRL SZ 7.5 (GLOVE) ×1 IMPLANT
GLOVE SUPERSENSE BIOGEL SZ 7.5 (GLOVE) ×1
GOWN STRL REUS W/ TWL XL LVL3 (GOWN DISPOSABLE) ×3 IMPLANT
GOWN STRL REUS W/TWL XL LVL3 (GOWN DISPOSABLE) ×6
GRASPER SUT TROCAR 14GX15 (MISCELLANEOUS) IMPLANT
HEMOSTAT ARISTA ABSORB 3G PWDR (HEMOSTASIS) IMPLANT
HEMOSTAT SNOW SURGICEL 2X4 (HEMOSTASIS) IMPLANT
IRRIG SUCT STRYKERFLOW 2 WTIP (MISCELLANEOUS) ×2
IRRIGATION SUCT STRKRFLW 2 WTP (MISCELLANEOUS) ×1 IMPLANT
KIT BASIN OR (CUSTOM PROCEDURE TRAY) ×2 IMPLANT
KIT TURNOVER KIT A (KITS) IMPLANT
L-HOOK LAP DISP 36CM (ELECTROSURGICAL)
LHOOK LAP DISP 36CM (ELECTROSURGICAL) IMPLANT
POUCH RETRIEVAL ECOSAC 10 (ENDOMECHANICALS) ×1 IMPLANT
POUCH RETRIEVAL ECOSAC 10MM (ENDOMECHANICALS) ×2
SCISSORS LAP 5X35 DISP (ENDOMECHANICALS) ×2 IMPLANT
SET CHOLANGIOGRAPH MIX (MISCELLANEOUS) IMPLANT
SET TUBE SMOKE EVAC HIGH FLOW (TUBING) ×2 IMPLANT
SLEEVE Z-THREAD 5X100MM (TROCAR) ×4 IMPLANT
SPIKE FLUID TRANSFER (MISCELLANEOUS) ×1 IMPLANT
SPONGE GAUZE 2X2 STER 10/PKG (GAUZE/BANDAGES/DRESSINGS) ×1
STRIP CLOSURE SKIN 1/2X4 (GAUZE/BANDAGES/DRESSINGS) ×1 IMPLANT
SUT MNCRL AB 4-0 PS2 18 (SUTURE) ×2 IMPLANT
SUT VIC AB 0 UR5 27 (SUTURE) IMPLANT
SUT VICRYL 0 TIES 12 18 (SUTURE) ×1 IMPLANT
SUT VICRYL 0 UR6 27IN ABS (SUTURE) IMPLANT
TOWEL OR 17X26 10 PK STRL BLUE (TOWEL DISPOSABLE) ×2 IMPLANT
TOWEL OR NON WOVEN STRL DISP B (DISPOSABLE) ×2 IMPLANT
TRAY LAPAROSCOPIC (CUSTOM PROCEDURE TRAY) ×2 IMPLANT
TROCAR 11X100 Z THREAD (TROCAR) IMPLANT
TROCAR BALLN 12MMX100 BLUNT (TROCAR) IMPLANT
TROCAR XCEL NON-BLD 5MMX100MML (ENDOMECHANICALS) ×1 IMPLANT
TROCAR Z-THREAD OPTICAL 5X100M (TROCAR) ×2 IMPLANT

## 2022-05-21 NOTE — Discharge Instructions (Addendum)
Ok to do salt water gargles at home. Follow a full liquid or soft diet at home for 7 days. Follow up with ENT as needed if persistent sore throat.   [facility] LAPAROSCOPIC [facility]GERY: POST OP INSTRUCTIONS Always review your discharge instruction sheet given to you by the facility where your surgery was performed. IF YOU HAVE DISABILITY OR FAMILY LEAVE FORMS, YOU MUST BRING THEM TO THE OFFICE FOR PROCESSING.   DO NOT GIVE THEM TO YOUR DOCTOR.  PAIN CONTROL  First take acetaminophen (Tylenol) AND/or ibuprofen (Advil) to control your pain after surgery.  Follow directions on package.  Taking acetaminophen (Tylenol) and/or ibuprofen (Advil) regularly after surgery will help to control your pain and lower the amount of prescription pain medication you may need.  You should not take more than 3,000 mg (3 grams) of acetaminophen (Tylenol) in 24 hours.  You should not take ibuprofen (Advil), aleve, motrin, naprosyn or other NSAIDS if you have a history of stomach ulcers or chronic kidney disease.  A prescription for pain medication may be given to you upon discharge.  Take your pain medication as prescribed, if you still have uncontrolled pain after taking acetaminophen (Tylenol) or ibuprofen (Advil). Use ice packs to help control pain. If you need a refill on your pain medication, please contact your pharmacy.  They will contact our office to request authorization. Prescriptions will not be filled after 5pm or on week-ends.  HOME MEDICATIONS Take your usually prescribed medications unless otherwise directed.  DIET You should follow a light diet the first few days after arrival home.  Be sure to include lots of fluids daily. Avoid fatty, fried foods.   CONSTIPATION It is common to experience some constipation after surgery and if you are taking pain medication.  Increasing fluid intake and taking a stool softener (such as Colace) will usually help or prevent this problem from  occurring.  A mild laxative (Milk of Magnesia or Miralax) should be taken according to package instructions if there are no bowel movements after 48 hours.  WOUND/INCISION CARE Most patients will experience some swelling and bruising in the area of the incisions.  Ice packs will help.  Swelling and bruising can take several days to resolve.  Unless discharge instructions indicate otherwise, follow guidelines below  STERI-STRIPS - you may remove your outer bandages 48 hours after surgery, and you may shower at that time.  You have steri-strips (small skin tapes) in place directly over the incision.  These strips should be left on the skin for 7-10 days.   DERMABOND/SKIN GLUE - you may shower in 24 hours.  The glue will flake off over the next 2-3 weeks. Any sutures or staples will be removed at the office during your follow-up visit.  ACTIVITIES You may resume regular (light) daily activities beginning the next day--such as daily self-care, walking, climbing stairs--gradually increasing activities as tolerated.  You may have sexual intercourse when it is comfortable.  Refrain from any heavy lifting or straining until approved by your doctor. You may drive when you are no longer taking prescription pain medication, you can comfortably wear a seatbelt, and you can safely maneuver your car and apply brakes.  FOLLOW-UP You should see your doctor in the office for a follow-up appointment approximately 2-3 weeks after your surgery.  You should have been given your post-op/follow-up appointment when your surgery was scheduled.  If you did not receive a post-op/follow-up appointment, make sure that you call for this appointment  within a day or two after you arrive home to insure a convenient appointment time.   WHEN TO CALL YOUR DOCTOR: Fever over 101.0 Inability to urinate Continued bleeding from incision. Increased pain, redness, or drainage from the incision. Increasing abdominal pain  The clinic  staff is available to answer your questions during regular business hours.  Please don't hesitate to call and ask to speak to one of the nurses for clinical concerns.  If you have a medical emergency, go to the nearest emergency room or call 911.  A surgeon from [facility] is always on call at the hospital. [address] [place], [place], [place]  [address] ? [address], [place], [place]   [address] [phone] ? [phone] ? FAX [phone] Web site: [email or web address]

## 2022-05-21 NOTE — Interval H&P Note (Signed)
History and Physical Interval Note:  05/21/2022 12:43 PM  Tracy Morris  has presented today for surgery, with the diagnosis of symptomatic cholelithiasis.  The various methods of treatment have been discussed with the patient and family. After consideration of risks, benefits and other options for treatment, the patient has consented to  Procedure(s): LAPAROSCOPIC CHOLECYSTECTOMY WITH IOC (N/A) as a surgical intervention.  The patient's history has been reviewed, patient examined, no change in status, stable for surgery.  I have reviewed the patient's chart and labs.  Questions were answered to the patient's satisfaction.     Tracy Morris

## 2022-05-21 NOTE — Op Note (Signed)
Tracy Morris [id number] 1986-05-14 05/21/2022  Laparoscopic Cholecystectomy with ATTEMPTED IOC, Diagnostic laparoscopy & Laparoscopic lysis of adhesion Procedure Note  Indications: This patient presents with postprandial epigastric pain and had presented to the emergency room where CT scan demonstrated cholelithiasis.  She had also eaten a large meal earlier that evening.  She had had a laparoscopic Roux-en-Y gastric bypass in 2022.  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 The risks, benefits, complications, treatment options, and expected outcomes were discussed with the patient. The possibilities of reaction to medication, pulmonary aspiration, perforation of viscus, bleeding, recurrent infection, finding a normal gallbladder, the need for additional procedures, failure to diagnose a condition, the possible need to convert to an open procedure, and creating a complication requiring transfusion or operation were discussed with the patient. The likelihood of improving the patient's symptoms with return to their baseline status is good.  The patient and/or family concurred with the proposed plan, giving informed consent. The site of surgery properly noted. The patient was taken to Operating Room, identified as Tracy Morris and the procedure verified as Laparoscopic Cholecystectomy with Intraoperative Cholangiogram. A Time Out was held and the above information confirmed. Antibiotic prophylaxis was administered.  The patient had had a prior  abdominoplasty.  Prior to the induction of general anesthesia, antibiotic prophylaxis was administered. General endotracheal anesthesia was then administered and tolerated well.  There had been a little bit of bleeding from one of her tonsils on intubation.  After the induction, the abdomen was prepped with Chloraprep and draped in the sterile fashion. The patient was positioned in the supine position.  Local anesthetic agent was injected into the skin near the umbilicus and an incision made. We dissected down to the abdominal fascia with blunt dissection.  Her umbilical stalk was in the area where I made the infraumbilical incision.  They repositioned her umbilicus but not her umbilical stalk.  I therefore decided to gain access using the Optiview technique in the left upper quadrant.  A small incision was made through her old scar at Palmer's point.  Then using a 0 degree 5 mm laparoscope through a 5 mm trocar I carefully advanced it through all layers of the abdominal wall and entered the abdominal cavity carefully.  Pneumoperitoneum was smoothly established up to a patient pressure of 15 mmHg.  The abdominal cavity was surveilled.  There was no evidence of injury to surrounding structures.  I looked at the infraumbilical location.  I had not come through the peritoneum yet.  Using a knife I incised the peritoneum in the infraumbilical position under laparoscopic guidance.  I then placed a 12 mm Hassan trocar and inflated the balloon.  The camera was placed in that trocar and inspected the left upper quadrant.  There was no evidence of injury to surrounding structures where I came in with the Optiview trocar.  It was placed in reverse Trendelenburg and rotated to the left.. An 5-mm port was placed in the subxiphoid position.  Two 5-mm ports were placed in the right upper quadrant. All skin incisions were infiltrated with a local anesthetic agent before making the incision and placing the trocars.  The  gallbladder was identified, the fundus grasped and retracted cephalad. Adhesions were lysed bluntly and with the electrocautery where indicated, taking care not to injure any adjacent organs or viscus. The infundibulum was grasped and retracted laterally, exposing the peritoneum overlying the triangle of Calot. This was then divided and exposed in a blunt fashion. A critical view of the cystic duct and cystic artery was obtained.  The cystic duct was clearly identified and bluntly dissected circumferentially. The cystic duct was ligated with a clip distally.   An incision was made in the cystic duct and I attempted to introduce the [facility] cholangiogram catheter into the cystic duct through the [place].  However the cystic duct ended up becoming transected where made the partial cut with EndoShears.  At this point I decided not to proceed with the cholangiogram.  The patient had normal preoperative LFTs and her cystic duct was normal on preoperative imaging.  I was able to grab the cystic duct stump and place 3 clips across the cystic duct stump. The catheter was then removed.    The cystic artery which had been identified & dissected free was ligated with clips and divided as well.   The gallbladder was dissected from the liver bed in retrograde fashion with the electrocautery.  The gallbladder wall was very thin.  There was some spillage of bile as well as a few gallstones.  The gallbladder was removed and placed in an Ecco sac.  The gallbladder and Ecco sac were then removed through the umbilical port site. The liver bed was irrigated and inspected. Hemostasis was achieved with the electrocautery.  1 and half liters of copious irrigation was utilized and was repeatedly aspirated until clear.  I did not see any stones floating in the abdominal cavity.   I decided to run her Roux limb and look at her jejunojejunostomy to make sure there is no mesenteric defect since the patient had had epigastric pain and I  want to rule out a bariatric cause of her pain.  Patient had a little bit of a long candycane or blind end of the Roux limb.  I ran her Roux limb starting at the gastro jejunostomy going down toward the jejunojejunostomy.  I came across a dense adhesive band between the Roux limb and the distal Roux limb just as it entered the jejunojejunostomy.  The jejunojejunostomy was inspected.  Identified the biliopancreatic limb and ran it back proximally and this appeared normal.  I then identified the common channel and ran it distally for about 60 or 70 cm and there is no abnormality.  I then came back up to the jejunojejunostomy.  There was no mesenteric defect.  No Petersons defect.  I then reinspected the area of the Roux limb where the adhesive band was to the distal Roux limb as it entered the jejunojejunostomy.  I lifted this up and there was evidence of what appeared to be chronic indentation on that section of the small bowel mesentery almost like it had been functioning as perhaps a closed-loop.  There is no band in that location other than the interloop adhesion between the distal Roux limb and where the Roux limb entered the jejunojejunostomy.  I was able to place a Glassman grasper through this.  One of my partners came in the OR and visualized area with me.  I decided to take down this dense band between the 2 sections of the Roux limb.  Using EndoShears I transected it.  We then carefully  inspected both bowel walls.  There was no full-thickness injury.  There is no really evidence of serosal tear.  There appeared to be no luminal defect.  I then closed the umbilical fascia with 3 interrupted 0 Vicryl's using PMI suture passer with laparoscopic guidance.  Local was infiltrated in this area  We again inspected the right upper quadrant for hemostasis.  The umbilical closure was inspected and there was no air leak and nothing trapped within the closure. Pneumoperitoneum was released as we removed the trocars.   4-0 Monocryl was used to close the skin.    steri-strips, and clean dressings were applied. The patient was then extubated and brought to the recovery room in stable condition. Instrument, sponge, and needle counts were correct at closure and at the conclusion of the case.   Findings: +critical view No mesenteric defect at the jejunojejunostomy Dense adhesive band between the Roux limb and the distal Roux limb where it entered the jejunojejunostomy which was taken  Estimated Blood Loss: Minimal         Drains: none         Specimens: Gallbladder           Complications: None; patient tolerated the procedure well.         Disposition: PACU - hemodynamically stable.         Condition: stable  [name]. Redmond Pulling, MD, FACS General, Bariatric, & Minimally Invasive Surgery [facility], [place]

## 2022-05-21 NOTE — Anesthesia Procedure Notes (Signed)
Procedure Name: Intubation Date/Time: 05/21/2022 1:29 PM  Performed by: Sharlette Dense, CRNAPatient Re-evaluated:Patient Re-evaluated prior to induction Oxygen Delivery Method: Circle system utilized Preoxygenation: Pre-oxygenation with 100% oxygen Induction Type: IV induction Ventilation: Mask ventilation without difficulty and Oral airway inserted - appropriate to patient size Laryngoscope Size: Glidescope and 3 Grade View: Grade I Tube type: Parker flex tip Tube size: 7.5 mm Number of attempts: 2 Airway Equipment and Method: Stylet and Video-laryngoscopy Placement Confirmation: ETT inserted through vocal cords under direct vision, positive ETCO2 and breath sounds checked- equal and bilateral Secured at: 22 cm Tube secured with: Tape Dental Injury: Bloody posterior oropharynx  Difficulty Due To: Difficulty was anticipated Comments: Patient with grade 3 previous airway.  Glidescope chosen for intubation.  Advance 3 blade and then ETT.  Difficult to advance tube and tonsillar pillar scraped.  Bleeding noted on screen and suctioned.  2nd attempt successful.  Airway suctioned of blood.  Will extubate when patient fully awake.

## 2022-05-21 NOTE — H&P (Signed)
PROVIDER: Kazden Largo Leanne Chang, MD  MRN: [id number] DOB: 1986/07/28 DATE OF ENCOUNTER: 05/10/2022 Subjective   Chief Complaint: Follow-up (cholecystitis)   History of Present Illness: Tracy Morris is a 36 y.o. female who is seen today for for follow-up from a recent ED visit over the weekend. She underwent laparoscopic Roux-en-Y gastric bypass on September 25, 2021 . She was discharged on December 13. Her preoperative weight was 288 pounds. Her last visit weight was 218 pounds on 01/16/22. For the first few months after surgery she had difficulty with tolerating solid proteins. We had actually ended up getting an upper GI at 1 point.  She ended up in the ED on July 23 with complaints of abdominal pain and nausea and vomiting. She told them she had a chicken and broccoli and been having persistent vomiting as well as severe epigastric abdominal pain. She had labs and imaging  She states that she went for her biopsy that night and that was the first time she had a plate of grilled vegetables since her surgery. She states after a few bites of eating the vegetables which consisted of broccoli, cauliflower zucchini and squash she states "all hell broke out ". She had severe epigastric pain. This is followed by a sensation of being flushed and hot and she started vomiting. She states that it felt like a burning sensation in her upper abdomen in the epigastric area and slightly to the right side. She has not had any additional symptoms since the ER visit. No fever or chills.  Been taking supplemental thiamine in addition to her One-A-Day bariatric vitamin and calcium. Review of Systems: A complete review of systems was obtained from the patient. I have reviewed this information and discussed as appropriate with the patient. See HPI as well for other ROS.  ROS   Medical History: Past Medical History:  Diagnosis Date  GERD (gastroesophageal reflux disease)  Prediabetes   There is no problem list on  file for this patient.  Past Surgical History:  Procedure Laterality Date  tummy tuck    No Known Allergies  Current Outpatient Medications on File Prior to Visit  Medication Sig Dispense Refill  acetaminophen (TYLENOL) 160 mg/5 mL suspension Take 640 mg by mouth every 6 (six) hours  biotin 1,000 mcg Chew Take by mouth  calcium carbonate (TUMS E-X) 300 mg (750 mg) chewable tablet Take 300 mg of elemental by mouth once daily  cholecalciferol 1000 unit tablet Take by mouth  linaCLOtide (LINZESS) 72 mcg capsule Take 1 capsule (72 mcg total) by mouth once daily (Patient not taking: Reported on 05/10/2022) 30 capsule 1  norethindrone (MICRONOR) 0.35 mg tablet Jencycla 0.35 mg tablet TAKE 1 TABLET BY MOUTH ONCE DAILY (Patient not taking: Reported on 05/10/2022)  OZEMPIC pen injector INJECT '1MG'$  SUBCUTANEOUS EVERY WEEK (Patient not taking: Reported on 05/10/2022)  pantoprazole (PROTONIX) 40 MG DR tablet Take 1 tablet (40 mg total) by mouth once daily (Patient not taking: Reported on 05/10/2022) 30 tablet 1  prochlorperazine (COMPAZINE) 10 MG tablet TAKE 1 TABLET BY MOUTH TWICE A DAY FOR MIGRAINE. TAKE WITH BENADRYL (Patient not taking: Reported on 05/10/2022)  WEGOVY 0.5 mg/0.5 mL pen injector INJECT 0.'5MG'$  INTO THE SKIN ONCE A WEEK (Patient not taking: Reported on 05/10/2022)   No current facility-administered medications on file prior to visit.   History reviewed. No pertinent family history.   Social History   Tobacco Use  Smoking Status Never  Smokeless Tobacco Never    Social History  Socioeconomic History  Marital status: Single  Tobacco Use  Smoking status: Never  Smokeless tobacco: Never  Vaping Use  Vaping Use: Never used  Substance and Sexual Activity  Alcohol use: Yes  Drug use: Never   Objective:   Vitals:  05/10/22 0904  BP: 124/82  Pulse: 88  Temp: 36.6 C (97.9 F)  Weight: 85.6 kg (188 lb 12.8 oz)  Height: 160 cm ('5\' 3"'$ )   Body mass index is 33.44  kg/m.  Gen: alert, NAD, non-toxic appearing Pupils: equal, no scleral icterus Pulm: Lungs clear to auscultation, symmetric chest rise CV: regular rate and rhythm Abd: soft, nontender, nondistended. old trocar sites. No cellulitis. No incisional hernia Ext: no edema,  Skin: no rash, no jaundice  Labs, Imaging and Diagnostic Testing:  CBC was within normal limits hemoglobin was slightly low at 11.2 and hematocrit 34.9: Urinalysis showed large hemoglobin, 20 ketones and more than 50 RBCs otherwise negative MI: Lipase negative,  Comprehensive metabolic panel was essentially normal except for a mild hypokalemia of 3.3, calcium 8.8, AST low at 13 otherwise her LFTs were normal.  Abdominal ultrasound FINDINGS: Gallbladder:  Cholelithiasis. A negative sonographic Percell Miller sign was reported by the sonographer. No pericholecystic fluid or gallbladder wall thickening.  Common bile duct:  Diameter: 3 mm  Liver:  No focal lesion identified. Within normal limits in parenchymal echogenicity. Portal vein is patent on color Doppler imaging with normal direction of blood flow towards the liver.  Other: None.  IMPRESSION: Cholelithiasis without other evidence of acute cholecystitis.  CT abdomen pelvis that night as well IMPRESSION: Status post gastric bypass. No evidence of bowel obstruction.  Uterine fibroids.  Assessment and Plan:  Diagnoses and all orders for this visit:  Obesity due to excess calories, unspecified classification, unspecified whether serious comorbidity present  Gastric bypass status for obesity  Symptomatic cholelithiasis  Vitamin D deficiency  Vitamin B1 deficiency    Potential that she just ate a certain texture that did not agree with her which caused her upper abdominal pain but it is possible that she could also have symptomatic cholelithiasis since she has had several episodes of upper abdominal pain after eating. So I think it is reasonable to offer  her cholecystectomy. She states that she really wants to avoid that type of pain again  We discussed gallbladder disease. The patient was given Neurosurgeon. We discussed non-operative and operative management. We discussed the signs & symptoms of acute cholecystitis  I discussed laparoscopic cholecystectomy with possible IOC in detail. The patient was given educational material as well as diagrams detailing the procedure. We discussed the risks and benefits of a laparoscopic cholecystectomy including, but not limited to bleeding, infection, injury to surrounding structures such as the intestine or liver, bile leak, retained gallstones, need to convert to an open procedure, prolonged diarrhea, blood clots such as DVT, common bile duct injury, anesthesia risks, and possible need for additional procedures. We discussed the typical post-operative recovery course. I explained that the likelihood of improvement of their symptoms is good.  We also need to monitor her vitamin D and thiamine deficiencies. She was given a lab requisition slip  This patient encounter took 31 minutes today to perform the following: take history, perform exam, review outside records, interpret imaging, counsel the patient on their diagnosis and document encounter, findings & plan in the EHR  No follow-ups on file.  [name]. Redmond Pulling MD FACS General, Minimally Invasive, & Bariatric Surgery Electronically signed by Rudean Curt, MD  at 05/10/2022 9:51 AM EDT

## 2022-05-21 NOTE — Transfer of Care (Signed)
Immediate Anesthesia Transfer of Care Note  Patient: Tracy Morris  Procedure(s) Performed: LAPAROSCOPIC CHOLECYSTECTOMY, DIAGNOSTIC LAPAROSCOPY, LYSIS OF ADHESIONS (Abdomen)  Patient Location: PACU  Anesthesia Type:General  Level of Consciousness: drowsy  Airway & Oxygen Therapy: Patient Spontanous Breathing and Patient connected to face mask oxygen  Post-op Assessment: Report given to RN and Post -op Vital signs reviewed and stable  Post vital signs: Reviewed and stable  Last Vitals:  Vitals Value Taken Time  BP 132/85 05/21/22 1510  Temp    Pulse 86 05/21/22 1512  Resp 15 05/21/22 1512  SpO2 100 % 05/21/22 1512  Vitals shown include unvalidated device data.  Last Pain:  Vitals:   05/21/22 1133  TempSrc:   PainSc: 0-No pain         Complications:  Encounter Notable Events  Notable Event Outcome Phase Comment  Difficult to intubate - expected  Intraprocedure Filed from anesthesia note documentation.

## 2022-05-21 NOTE — Anesthesia Preprocedure Evaluation (Signed)
Anesthesia Evaluation  Patient identified by MRN, date of birth, ID band Patient awake    Reviewed: Allergy & Precautions, NPO status , Patient's Chart, lab work & pertinent test results  Airway Mallampati: II  TM Distance: >3 FB Neck ROM: Full    Dental  (+) Dental Advisory Given   Pulmonary neg pulmonary ROS,    breath sounds clear to auscultation       Cardiovascular negative cardio ROS   Rhythm:Regular Rate:Normal     Neuro/Psych negative neurological ROS     GI/Hepatic Neg liver ROS, GERD  ,S/p gastric bypass   Endo/Other  negative endocrine ROS  Renal/GU negative Renal ROS     Musculoskeletal   Abdominal   Peds  Hematology negative hematology ROS (+)   Anesthesia Other Findings   Reproductive/Obstetrics                             Lab Results  Component Value Date   WBC 5.0 05/06/2022   HGB 11.2 (L) 05/06/2022   HCT 34.9 (L) 05/06/2022   MCV 85.1 05/06/2022   PLT 254 05/06/2022   Lab Results  Component Value Date   CREATININE 0.85 05/05/2022   BUN 7 05/05/2022   NA 142 05/05/2022   K 3.3 (L) 05/05/2022   CL 108 05/05/2022   CO2 26 05/05/2022    Anesthesia Physical Anesthesia Plan  ASA: 2  Anesthesia Plan: General   Post-op Pain Management: Tylenol PO (pre-op)*   Induction: Intravenous  PONV Risk Score and Plan: 3 and Dexamethasone, Ondansetron, Midazolam and Treatment may vary due to age or medical condition  Airway Management Planned: Oral ETT  Additional Equipment: None  Intra-op Plan:   Post-operative Plan: Extubation in OR  Informed Consent: I have reviewed the patients History and Physical, chart, labs and discussed the procedure including the risks, benefits and alternatives for the proposed anesthesia with the patient or authorized representative who has indicated his/her understanding and acceptance.     Dental advisory given  Plan Discussed  with: CRNA  Anesthesia Plan Comments:         Anesthesia Quick Evaluation

## 2022-05-21 NOTE — Anesthesia Postprocedure Evaluation (Signed)
Anesthesia Post Note  Patient: Tracy Morris  Procedure(s) Performed: LAPAROSCOPIC CHOLECYSTECTOMY, DIAGNOSTIC LAPAROSCOPY, LYSIS OF ADHESIONS (Abdomen)     Patient location during evaluation: PACU Anesthesia Type: General Level of consciousness: awake and alert Pain management: pain level controlled Vital Signs Assessment: post-procedure vital signs reviewed and stable Respiratory status: spontaneous breathing, nonlabored ventilation, respiratory function stable and patient connected to nasal cannula oxygen Cardiovascular status: blood pressure returned to baseline and stable Postop Assessment: no apparent nausea or vomiting Anesthetic complications: yes Comments: Pt had a soft palate injury when able to examine in PACU after awake and able to open mouth fully. 1.5cm defect noted in right upper soft palate. There was no bleeding or drainage coming from the injury and the defect was attached both anteriorly and posteriorly on the soft palate. Discussed with Dr. Marcelline Deist, ENT on call physician. He recommends conservative management with salt water gurgles, ice water, soft food diet for 1 week, oral analgesics as needed and a 5 day course of antibiotic. He does not recommend repairing the defect as these types of injuries will scar and heal on their own. I discussed this with the patient and the patients mother and they voiced understanding. All questions answered.   Deatra Canter, MD   Encounter Notable Events  Notable Event Outcome Phase Comment  Difficult to intubate - expected  Intraprocedure Filed from anesthesia note documentation.    Last Vitals:  Vitals:   05/21/22 1630 05/21/22 1645  BP: 124/86 130/74  Pulse: 77 72  Resp: 12 15  Temp:  36.4 C  SpO2: 95% 95%    Last Pain:  Vitals:   05/21/22 1645  TempSrc:   PainSc: [name]

## 2022-05-22 ENCOUNTER — Encounter (HOSPITAL_COMMUNITY): Payer: Self-pay | Admitting: General Surgery

## 2022-05-22 LAB — SURGICAL PATHOLOGY

## 2022-12-05 ENCOUNTER — Telehealth: Payer: Self-pay

## 2022-12-05 NOTE — Telephone Encounter (Signed)
Mychart msg sent. AS, CMA

## 2023-06-14 DIAGNOSIS — H5213 Myopia, bilateral: Secondary | ICD-10-CM | POA: Diagnosis not present

## 2023-07-21 IMAGING — RF DG UGI W SINGLE CM
12 of 20 series · 12 of 24 positions shown · non-contrast
Comparison: None

CLINICAL DATA: A 35-year-old female presents for preoperative
evaluation in preparation for bariatric surgery.

EXAM:
UPPER GI SERIES WITH KUB
TECHNIQUE: After obtaining a scout radiograph a routine upper GI series was
performed using thin barium
FLUOROSCOPY TIME:  Fluoroscopy Time:  3 minute 6 seconds
Radiation Exposure Index (if provided by the fluoroscopic device):
59.2 mGy
Number of Acquired Spot Images: 2

[Series 3: cp_standard · 0.34mm/px · 1 of 19 frames shown (1 of 12)]
[frame 10/19]
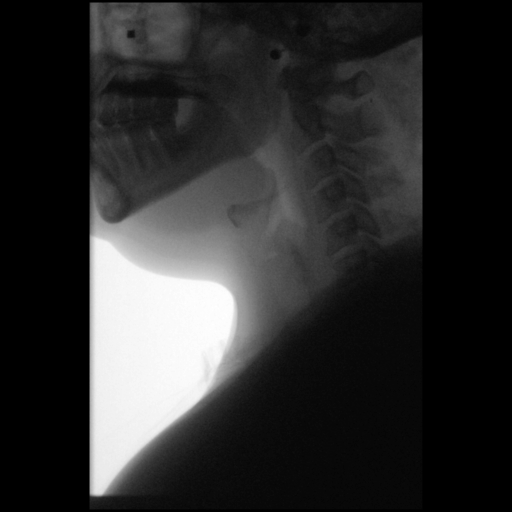

[Series 4: cp_standard · 0.34mm/px · 1 of 14 frames shown (2 of 12)]
[frame 8/14]
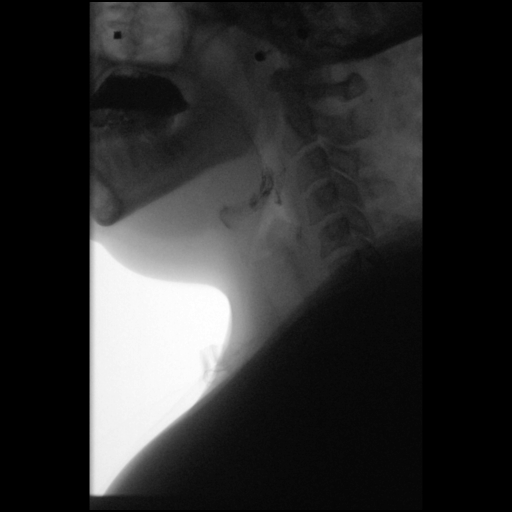

[Series 6: cp_standard · 0.35mm/px · 1 of 5 frames shown (3 of 12)]
[frame 1/5]
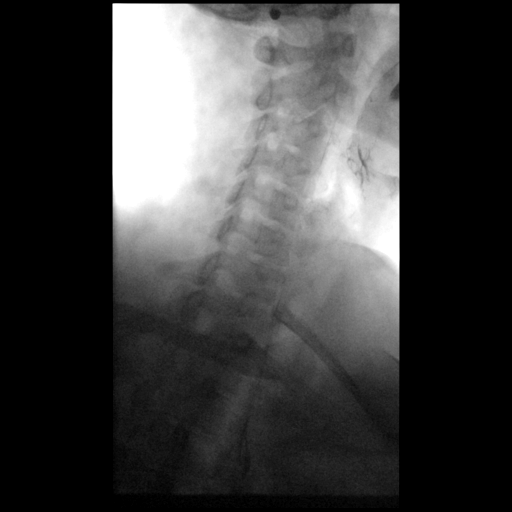

[Series 7: cp_standard · 0.35mm/px · 1 of 8 frames shown (4 of 12)]
[frame 7/8]
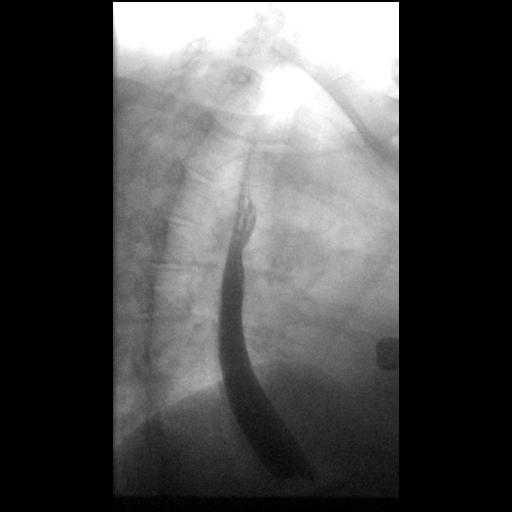

[Series 9: cp_standard · 0.35mm/px · 1 of 6 frames shown (5 of 12)]
[frame 1/6]
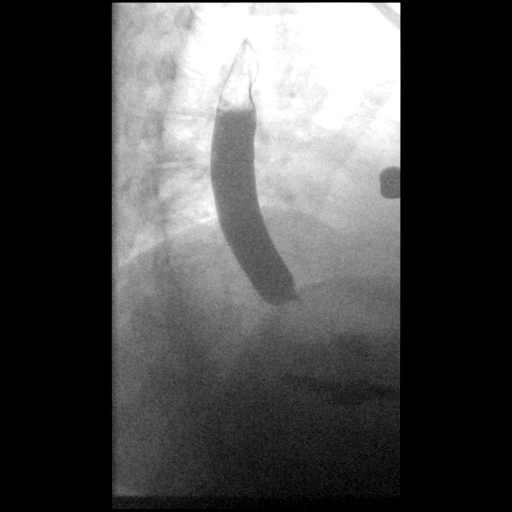

[Series 10: cp_standard · 0.35mm/px · 1 of 5 frames shown (6 of 12)]
[frame 3/5]
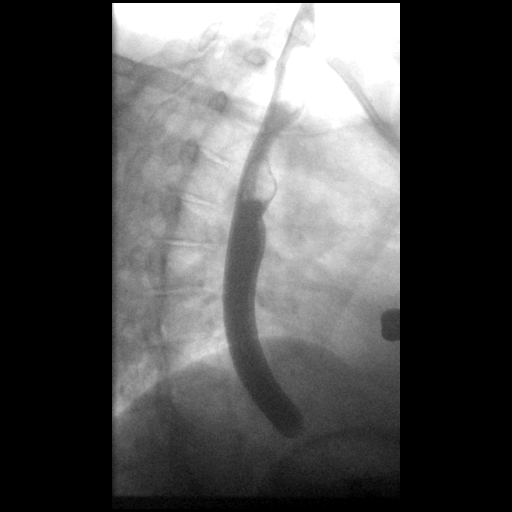

[Series 12: cp_standard · 0.35mm/px · 1 of 9 frames shown (7 of 12)]
[frame 5/9]
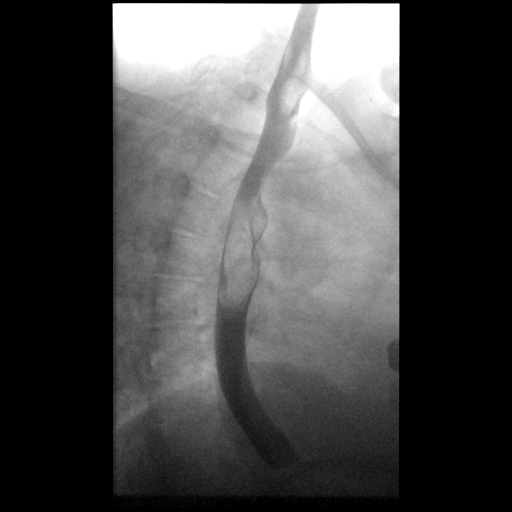

[Series 13: cp_standard · 0.35mm/px · 1 of 3 frames shown (8 of 12)]
[frame 3/3]
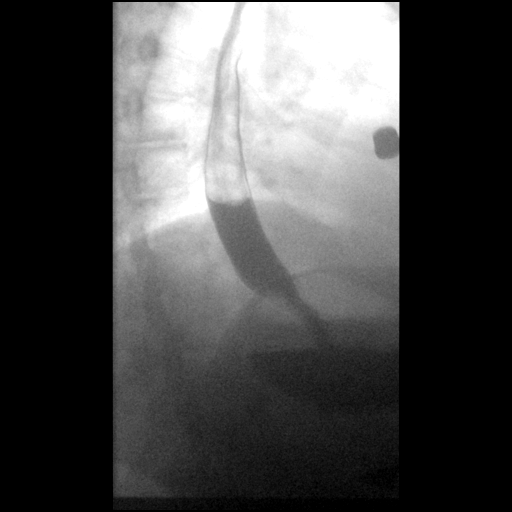

[Series 15: cp_standard · 0.36mm/px · 1 of 3 frames shown (9 of 12)]
[frame 2/3]
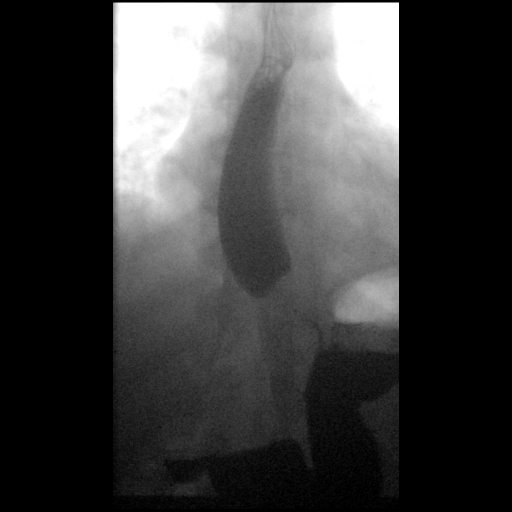

[Series 17: cp_standard · 0.36mm/px · 1 of 11 frames shown (10 of 12)]
[frame 2/11]
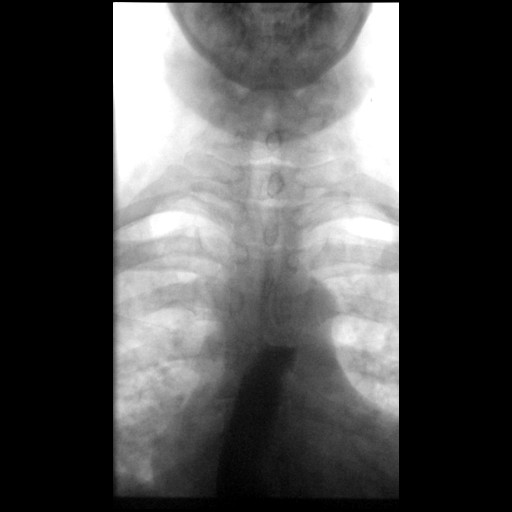

[Series 19: cp_standard · 0.19mm/px · 1 of 1 slices shown (11 of 12)]
[im 1/1]
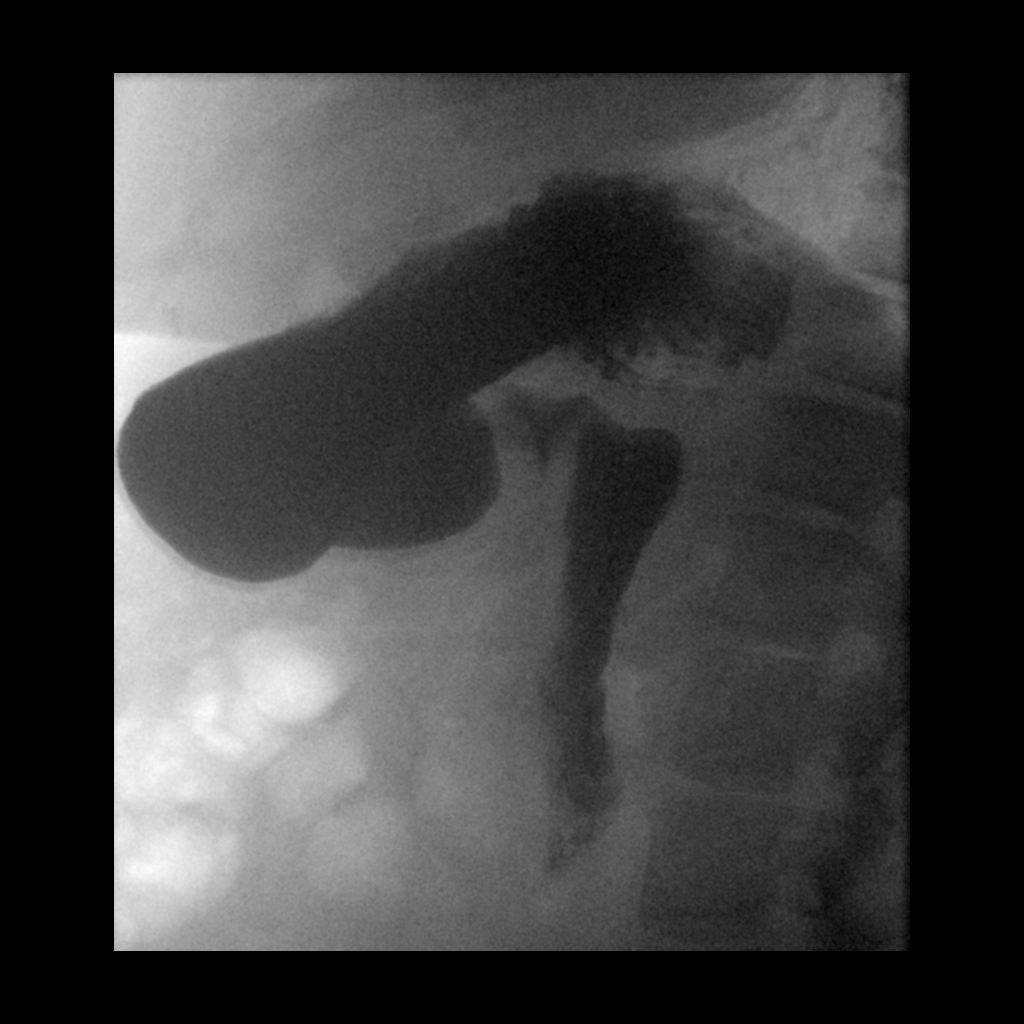

[Series 24: cp_standard · 0.20mm/px · 1 of 1 slices shown (12 of 12)]
[im 1/1]
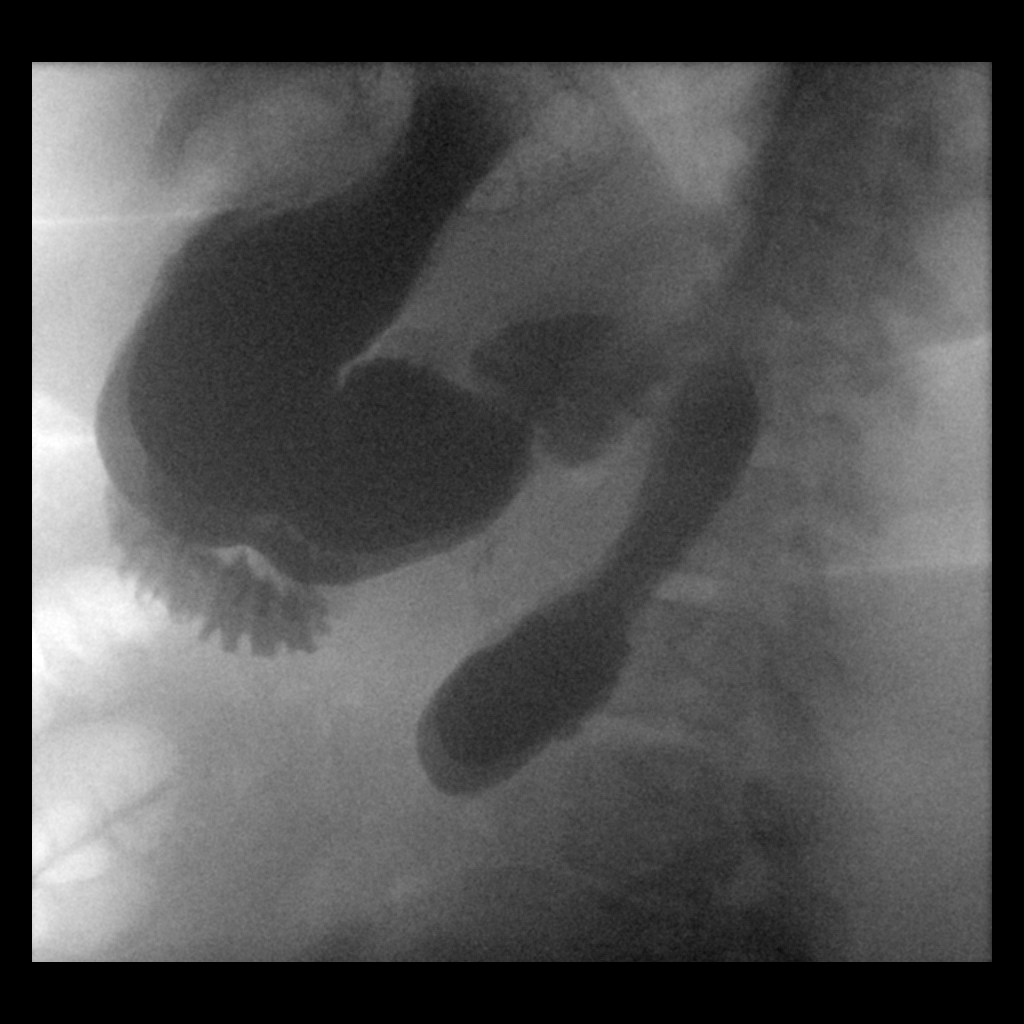

[12 of 24 positions shown; findings below may reference images not displayed]

FINDINGS: Bowel gas pattern with moderate stool in the colon. No signs of
obstruction.

Regional skeletal structures without acute findings. No abnormal
calcifications.

Swallowing first performed in the lateral projection without gross
swallow dysfunction.

LPO projection with normal esophageal distensibility, ready emptying
into the stomach.

Stomach grossly normal on single contrast assessment. Duodenal bulb
is normal with retroperitoneal course of the duodenum. Normal
position of the ligament of Treitz across the midline at L1-2.

Assessment with water siphon test shows distension of the distal
esophagus and reflux into the esophagus from the stomach to the mid
esophageal level.
IMPRESSION: Normal appearance of the esophagus, stomach and proximal small
bowel. Moderate to marked gastroesophageal reflux. No hiatal hernia.

## 2024-03-16 ENCOUNTER — Inpatient Hospital Stay: Payer: Self-pay | Attending: Hematology and Oncology | Admitting: Hematology and Oncology

## 2024-03-16 ENCOUNTER — Inpatient Hospital Stay: Payer: Self-pay

## 2024-03-16 DIAGNOSIS — D509 Iron deficiency anemia, unspecified: Secondary | ICD-10-CM | POA: Insufficient documentation

## 2024-03-16 DIAGNOSIS — D72819 Decreased white blood cell count, unspecified: Secondary | ICD-10-CM | POA: Insufficient documentation
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
## 2024-04-10 ENCOUNTER — Other Ambulatory Visit: Payer: Self-pay | Admitting: Medical Oncology

## 2024-04-10 ENCOUNTER — Encounter: Payer: Self-pay | Admitting: Physician Assistant

## 2024-04-10 ENCOUNTER — Other Ambulatory Visit: Payer: Self-pay | Admitting: Physician Assistant

## 2024-04-10 ENCOUNTER — Inpatient Hospital Stay: Payer: Self-pay

## 2024-04-10 ENCOUNTER — Inpatient Hospital Stay (HOSPITAL_BASED_OUTPATIENT_CLINIC_OR_DEPARTMENT_OTHER): Payer: Self-pay | Admitting: Physician Assistant

## 2024-04-10 VITALS — Resp 18 | Ht 63.0 in | Wt 177.0 lb

## 2024-04-10 DIAGNOSIS — Z9884 Bariatric surgery status: Secondary | ICD-10-CM

## 2024-04-10 DIAGNOSIS — D72819 Decreased white blood cell count, unspecified: Secondary | ICD-10-CM

## 2024-04-10 DIAGNOSIS — D649 Anemia, unspecified: Secondary | ICD-10-CM | POA: Insufficient documentation

## 2024-04-10 DIAGNOSIS — D509 Iron deficiency anemia, unspecified: Secondary | ICD-10-CM

## 2024-04-10 LAB — CBC WITH DIFFERENTIAL (CANCER CENTER ONLY)
Abs Immature Granulocytes: 0 10*3/uL (ref 0.00–0.07)
Basophils Absolute: 0 10*3/uL (ref 0.0–0.1)
Basophils Relative: 0 %
Eosinophils Absolute: 0.1 10*3/uL (ref 0.0–0.5)
Eosinophils Relative: 3 %
HCT: 34 % — ABNORMAL LOW (ref 36.0–46.0)
Hemoglobin: 11.2 g/dL — ABNORMAL LOW (ref 12.0–15.0)
Immature Granulocytes: 0 %
Lymphocytes Relative: 40 %
Lymphs Abs: 1 10*3/uL (ref 0.7–4.0)
MCH: 26.5 pg (ref 26.0–34.0)
MCHC: 32.9 g/dL (ref 30.0–36.0)
MCV: 80.4 fL (ref 80.0–100.0)
Monocytes Absolute: 0.4 10*3/uL (ref 0.1–1.0)
Monocytes Relative: 15 %
Neutro Abs: 1 10*3/uL — ABNORMAL LOW (ref 1.7–7.7)
Neutrophils Relative %: 42 %
Platelet Count: 220 10*3/uL (ref 150–400)
RBC: 4.23 MIL/uL (ref 3.87–5.11)
RDW: 14.1 % (ref 11.5–15.5)
WBC Count: 2.4 10*3/uL — ABNORMAL LOW (ref 4.0–10.5)
nRBC: 0 % (ref 0.0–0.2)

## 2024-04-10 LAB — IRON AND IRON BINDING CAPACITY (CC-WL,HP ONLY)
Iron: 23 ug/dL — ABNORMAL LOW (ref 28–170)
Saturation Ratios: 5 % — ABNORMAL LOW (ref 10.4–31.8)
TIBC: 448 ug/dL (ref 250–450)
UIBC: 425 ug/dL (ref 148–442)

## 2024-04-10 LAB — CMP (CANCER CENTER ONLY)
ALT: 31 U/L (ref 0–44)
AST: 29 U/L (ref 15–41)
Albumin: 4.3 g/dL (ref 3.5–5.0)
Alkaline Phosphatase: 103 U/L (ref 38–126)
Anion gap: 7 (ref 5–15)
BUN: 9 mg/dL (ref 6–20)
CO2: 28 mmol/L (ref 22–32)
Calcium: 9.1 mg/dL (ref 8.9–10.3)
Chloride: 105 mmol/L (ref 98–111)
Creatinine: 0.97 mg/dL (ref 0.44–1.00)
GFR, Estimated: >60 mL/min (ref >60)
Glucose, Bld: 79 mg/dL (ref 70–99)
Potassium: 3.7 mmol/L (ref 3.5–5.1)
Sodium: 140 mmol/L (ref 135–145)
Total Bilirubin: 0.4 mg/dL (ref 0.0–1.2)
Total Protein: 7.1 g/dL (ref 6.5–8.1)

## 2024-04-10 LAB — C-REACTIVE PROTEIN: CRP: <0.5 mg/dL (ref <1.0)

## 2024-04-10 LAB — VITAMIN B12: Vitamin B-12: 495 pg/mL (ref 180–914)

## 2024-04-10 LAB — SEDIMENTATION RATE: Sed Rate: 10 mm/h (ref 0–22)

## 2024-04-10 LAB — HEPATITIS B SURFACE ANTIBODY,QUALITATIVE: Hep B S Ab: NONREACTIVE

## 2024-04-10 LAB — HEPATITIS B SURFACE ANTIGEN: Hepatitis B Surface Ag: NONREACTIVE

## 2024-04-10 LAB — RETICULOCYTES
Immature Retic Fract: 12.3 % (ref 2.3–15.9)
RBC.: 4.26 MIL/uL (ref 3.87–5.11)
Retic Count, Absolute: 57.9 10*3/uL (ref 19.0–186.0)
Retic Ct Pct: 1.4 % (ref 0.4–3.1)

## 2024-04-10 LAB — FOLATE: Folate: 9.7 ng/mL (ref >5.9)

## 2024-04-10 LAB — FERRITIN: Ferritin: 16 ng/mL (ref 11–307)

## 2024-04-10 LAB — HEPATITIS C ANTIBODY: HCV Ab: NONREACTIVE

## 2024-04-11 LAB — HEPATITIS B CORE ANTIBODY, TOTAL: HEP B CORE AB: NEGATIVE

## 2024-04-11 LAB — HIV ANTIBODY (ROUTINE TESTING W REFLEX): HIV Screen 4th Generation wRfx: NONREACTIVE

## 2024-04-13 ENCOUNTER — Other Ambulatory Visit: Payer: Self-pay | Admitting: Physician Assistant

## 2024-04-13 ENCOUNTER — Telehealth: Payer: Self-pay

## 2024-04-13 NOTE — Telephone Encounter (Signed)
 Hello,  Patient will be scheduled as soon as possible.  Auth Submission: NO AUTH NEEDED Site of care: Site of care: [facility] Payer: [employer] state Medication & CPT/J Code(s) submitted: Venofer (Iron Sucrose) J1756 Diagnosis Code:  Route of submission (phone, fax, portal): phone Phone # Fax # Auth type: Buy/Bill PB Units/visits requested: 200mg  x 5 doses Reference number:  Approval from: 04/13/24 to 10/14/24

## 2024-04-14 LAB — METHYLMALONIC ACID, SERUM: Methylmalonic Acid, Quantitative: 86 nmol/L (ref 0–378)

## 2024-04-15 ENCOUNTER — Telehealth: Payer: Self-pay | Admitting: Physician Assistant

## 2024-04-15 ENCOUNTER — Encounter: Payer: Self-pay | Admitting: Physician Assistant

## 2024-04-15 NOTE — Telephone Encounter (Signed)
 I attempted to call the patient to review all her labs from last week.  I also reviewed them with Dr. Federico.  Her viral studies folate, methylmalonic acid, and B12 were normal.  I did order the IV iron due to her iron deficiency.  Once her IV iron is scheduled, I will arrange for labs and a follow-up visit in 4 to 6 weeks after the last iron infusion with a lab appointment 1 week prior to her office visit.  I left a voicemail asking the patient to return the call so I can relay this message.  I will also send her a MyChart message as well.

## 2024-05-15 ENCOUNTER — Telehealth: Payer: Self-pay | Admitting: Physician Assistant

## 2024-05-15 NOTE — Telephone Encounter (Signed)
 I have made multiple attempts to contact the patient regarding follow-up from her recent office visit, without success. During that visit, I ordered IV iron infusions, which have not yet been scheduled. I called again today and left a voicemail encouraging the patient to reach out if she is still interested in proceeding with treatment. I also provided the phone number for the [facility] infusion center so she can arrange the infusions directly. Additionally, I have sent MyChart messages with this information. Once the infusions are scheduled, I will be better able to coordinate appropriate follow-up and blood work in the office.

## 2024-06-24 ENCOUNTER — Telehealth: Payer: Self-pay

## 2024-06-24 NOTE — Telephone Encounter (Signed)
 Spoke with patient regarding follow-up on iron infusions. Patient stated she is under the care of her primary care physician, is currently taking an iron supplement that is working well, and her primary care provider is monitoring her iron-related lab work. Patient stated she does not wish to proceed with iron infusions at this time. Advised patient to reach out if any needs arise in the future. Patient voiced understanding.
